# Patient Record
Sex: Male | Born: 1935 | Race: White | Hispanic: No | Marital: Single | Smoking: Former smoker
Health system: Southern US, Community
[De-identification: ages and names within clinical notes are randomized; demographics above are authoritative.]

## PROBLEM LIST (undated history)

## (undated) DIAGNOSIS — I7100 Dissection of unspecified site of aorta: Secondary | ICD-10-CM

## (undated) DIAGNOSIS — J449 Chronic obstructive pulmonary disease, unspecified: Secondary | ICD-10-CM

## (undated) DIAGNOSIS — F0150 Vascular dementia without behavioral disturbance: Secondary | ICD-10-CM

## (undated) DIAGNOSIS — I10 Essential (primary) hypertension: Secondary | ICD-10-CM

## (undated) DIAGNOSIS — G40909 Epilepsy, unspecified, not intractable, without status epilepticus: Secondary | ICD-10-CM

## (undated) DIAGNOSIS — N289 Disorder of kidney and ureter, unspecified: Secondary | ICD-10-CM

## (undated) DIAGNOSIS — G259 Extrapyramidal and movement disorder, unspecified: Secondary | ICD-10-CM

## (undated) DIAGNOSIS — I82409 Acute embolism and thrombosis of unspecified deep veins of unspecified lower extremity: Secondary | ICD-10-CM

## (undated) HISTORY — DX: Dissection of unspecified site of aorta: I71.00

## (undated) HISTORY — DX: Epilepsy, unspecified, not intractable, without status epilepticus: G40.909

## (undated) HISTORY — DX: Vascular dementia, unspecified severity, without behavioral disturbance, psychotic disturbance, mood disturbance, and anxiety: F01.50

## (undated) HISTORY — DX: Essential (primary) hypertension: I10

## (undated) HISTORY — DX: Disorder of kidney and ureter, unspecified: N28.9

## (undated) HISTORY — PX: HERNIA REPAIR: SHX51

## (undated) HISTORY — PX: APPENDECTOMY: SHX54

## (undated) HISTORY — DX: Extrapyramidal and movement disorder, unspecified: G25.9

## (undated) HISTORY — DX: Chronic obstructive pulmonary disease, unspecified: J44.9

## (undated) HISTORY — PX: PELVIC AND PARA-AORTIC LYMPH NODE DISSECTION: SHX2190

## (undated) HISTORY — DX: Acute embolism and thrombosis of unspecified deep veins of unspecified lower extremity: I82.409

---

## 2004-06-26 ENCOUNTER — Inpatient Hospital Stay: Payer: Self-pay | Admitting: Internal Medicine

## 2004-06-26 ENCOUNTER — Other Ambulatory Visit: Payer: Self-pay

## 2004-07-09 ENCOUNTER — Ambulatory Visit: Payer: Self-pay | Admitting: Internal Medicine

## 2004-07-13 ENCOUNTER — Ambulatory Visit: Payer: Self-pay | Admitting: Internal Medicine

## 2004-07-14 ENCOUNTER — Ambulatory Visit: Payer: Self-pay | Admitting: Internal Medicine

## 2004-07-17 ENCOUNTER — Ambulatory Visit: Payer: Self-pay | Admitting: Internal Medicine

## 2004-07-24 ENCOUNTER — Ambulatory Visit: Payer: Self-pay | Admitting: Internal Medicine

## 2004-07-30 ENCOUNTER — Ambulatory Visit: Payer: Self-pay | Admitting: Internal Medicine

## 2004-08-07 ENCOUNTER — Ambulatory Visit: Payer: Self-pay | Admitting: Internal Medicine

## 2004-08-14 ENCOUNTER — Ambulatory Visit: Payer: Self-pay | Admitting: Internal Medicine

## 2004-08-28 ENCOUNTER — Ambulatory Visit: Payer: Self-pay | Admitting: Internal Medicine

## 2004-09-11 ENCOUNTER — Ambulatory Visit: Payer: Self-pay | Admitting: Internal Medicine

## 2004-10-12 ENCOUNTER — Ambulatory Visit: Payer: Self-pay | Admitting: Internal Medicine

## 2004-11-09 ENCOUNTER — Ambulatory Visit: Payer: Self-pay | Admitting: Internal Medicine

## 2004-11-13 ENCOUNTER — Inpatient Hospital Stay (HOSPITAL_COMMUNITY): Admission: EM | Admit: 2004-11-13 | Discharge: 2004-11-24 | Payer: Self-pay | Admitting: Emergency Medicine

## 2004-11-13 ENCOUNTER — Ambulatory Visit: Payer: Self-pay | Admitting: Internal Medicine

## 2004-11-16 ENCOUNTER — Ambulatory Visit: Payer: Self-pay | Admitting: Cardiology

## 2004-11-16 ENCOUNTER — Encounter: Payer: Self-pay | Admitting: Cardiology

## 2004-11-26 ENCOUNTER — Ambulatory Visit: Payer: Self-pay | Admitting: Internal Medicine

## 2004-12-10 ENCOUNTER — Ambulatory Visit: Payer: Self-pay | Admitting: Internal Medicine

## 2004-12-24 ENCOUNTER — Ambulatory Visit: Payer: Self-pay | Admitting: Internal Medicine

## 2005-01-26 ENCOUNTER — Ambulatory Visit: Payer: Self-pay | Admitting: Internal Medicine

## 2005-02-08 ENCOUNTER — Ambulatory Visit: Payer: Self-pay | Admitting: Internal Medicine

## 2005-02-26 ENCOUNTER — Ambulatory Visit: Payer: Self-pay | Admitting: Internal Medicine

## 2005-06-11 ENCOUNTER — Ambulatory Visit: Payer: Self-pay | Admitting: Internal Medicine

## 2005-07-02 ENCOUNTER — Ambulatory Visit: Payer: Self-pay | Admitting: Internal Medicine

## 2005-08-03 ENCOUNTER — Ambulatory Visit: Payer: Self-pay | Admitting: Internal Medicine

## 2005-12-09 ENCOUNTER — Ambulatory Visit: Payer: Self-pay | Admitting: Internal Medicine

## 2005-12-15 ENCOUNTER — Ambulatory Visit: Payer: Self-pay | Admitting: Internal Medicine

## 2006-01-10 ENCOUNTER — Ambulatory Visit: Payer: Self-pay | Admitting: Internal Medicine

## 2006-01-31 ENCOUNTER — Ambulatory Visit: Payer: Self-pay | Admitting: Internal Medicine

## 2006-03-01 ENCOUNTER — Ambulatory Visit: Payer: Self-pay | Admitting: Internal Medicine

## 2006-03-14 ENCOUNTER — Ambulatory Visit: Payer: Self-pay | Admitting: Internal Medicine

## 2006-04-11 ENCOUNTER — Ambulatory Visit: Payer: Self-pay | Admitting: Internal Medicine

## 2006-04-11 LAB — CONVERTED CEMR LAB: Hgb A1c MFr Bld: 5.3 %

## 2006-04-14 ENCOUNTER — Ambulatory Visit: Payer: Self-pay | Admitting: Internal Medicine

## 2006-04-27 ENCOUNTER — Ambulatory Visit: Payer: Self-pay | Admitting: Gastroenterology

## 2006-05-11 ENCOUNTER — Encounter (INDEPENDENT_AMBULATORY_CARE_PROVIDER_SITE_OTHER): Payer: Self-pay | Admitting: Specialist

## 2006-05-11 ENCOUNTER — Ambulatory Visit: Payer: Self-pay | Admitting: Gastroenterology

## 2006-07-20 ENCOUNTER — Ambulatory Visit: Payer: Self-pay | Admitting: Internal Medicine

## 2006-09-26 ENCOUNTER — Encounter: Payer: Self-pay | Admitting: Internal Medicine

## 2006-09-26 DIAGNOSIS — I10 Essential (primary) hypertension: Secondary | ICD-10-CM | POA: Insufficient documentation

## 2006-09-26 DIAGNOSIS — E119 Type 2 diabetes mellitus without complications: Secondary | ICD-10-CM | POA: Insufficient documentation

## 2006-09-26 DIAGNOSIS — J449 Chronic obstructive pulmonary disease, unspecified: Secondary | ICD-10-CM

## 2006-12-08 ENCOUNTER — Encounter: Payer: Self-pay | Admitting: Internal Medicine

## 2006-12-16 ENCOUNTER — Encounter: Payer: Self-pay | Admitting: Internal Medicine

## 2007-01-11 ENCOUNTER — Encounter (INDEPENDENT_AMBULATORY_CARE_PROVIDER_SITE_OTHER): Payer: Self-pay | Admitting: *Deleted

## 2007-01-16 ENCOUNTER — Ambulatory Visit: Payer: Self-pay | Admitting: Internal Medicine

## 2007-01-17 LAB — CONVERTED CEMR LAB
Albumin: 3.4 g/dL — ABNORMAL LOW (ref 3.5–5.2)
CO2: 27 meq/L (ref 19–32)
GFR calc Af Amer: 77 mL/min
GFR calc non Af Amer: 64 mL/min
Glucose, Bld: 86 mg/dL (ref 70–99)
Phosphorus: 3.7 mg/dL (ref 2.3–4.6)
Sodium: 145 meq/L (ref 135–145)
TSH: 2.97 microintl units/mL (ref 0.35–5.50)

## 2007-03-22 ENCOUNTER — Telehealth: Payer: Self-pay | Admitting: Internal Medicine

## 2007-05-05 ENCOUNTER — Ambulatory Visit: Payer: Self-pay | Admitting: Internal Medicine

## 2007-07-04 ENCOUNTER — Telehealth (INDEPENDENT_AMBULATORY_CARE_PROVIDER_SITE_OTHER): Payer: Self-pay | Admitting: *Deleted

## 2007-07-20 ENCOUNTER — Ambulatory Visit: Payer: Self-pay | Admitting: Internal Medicine

## 2007-07-21 LAB — CONVERTED CEMR LAB
CO2: 29 meq/L (ref 19–32)
Calcium: 9.1 mg/dL (ref 8.4–10.5)
Creatinine, Ser: 1.2 mg/dL (ref 0.4–1.5)
Eosinophils Absolute: 0.3 10*3/uL (ref 0.0–0.6)
Eosinophils Relative: 4.3 % (ref 0.0–5.0)
GFR calc Af Amer: 77 mL/min
GFR calc non Af Amer: 64 mL/min
Glucose, Bld: 91 mg/dL (ref 70–99)
HCT: 42 % (ref 39.0–52.0)
Hemoglobin: 14.7 g/dL (ref 13.0–17.0)
Lymphocytes Relative: 10.5 % — ABNORMAL LOW (ref 12.0–46.0)
MCHC: 34.9 g/dL (ref 30.0–36.0)
Neutro Abs: 6 10*3/uL (ref 1.4–7.7)
Neutrophils Relative %: 77.8 % — ABNORMAL HIGH (ref 43.0–77.0)
PSA: 2.41 ng/mL (ref 0.10–4.00)
Sodium: 141 meq/L (ref 135–145)
TSH: 3.16 microintl units/mL (ref 0.35–5.50)
Total CHOL/HDL Ratio: 5.6
Triglycerides: 227 mg/dL (ref 0–149)

## 2007-07-28 ENCOUNTER — Ambulatory Visit: Payer: Self-pay

## 2007-07-28 ENCOUNTER — Encounter: Payer: Self-pay | Admitting: Internal Medicine

## 2008-01-22 ENCOUNTER — Ambulatory Visit: Payer: Self-pay | Admitting: Internal Medicine

## 2008-01-24 LAB — CONVERTED CEMR LAB
BUN: 13 mg/dL (ref 6–23)
Basophils Absolute: 0 10*3/uL (ref 0.0–0.1)
Calcium: 8.8 mg/dL (ref 8.4–10.5)
Chloride: 107 meq/L (ref 96–112)
Cholesterol: 147 mg/dL (ref 0–200)
Creatinine, Ser: 1.3 mg/dL (ref 0.4–1.5)
Direct LDL: 82.8 mg/dL
Eosinophils Absolute: 0.2 10*3/uL (ref 0.0–0.7)
Eosinophils Relative: 3.8 % (ref 0.0–5.0)
GFR calc Af Amer: 70 mL/min
Glucose, Bld: 57 mg/dL — ABNORMAL LOW (ref 70–99)
HCT: 39.5 % (ref 39.0–52.0)
Hemoglobin: 13.4 g/dL (ref 13.0–17.0)
Lymphocytes Relative: 13.2 % (ref 12.0–46.0)
Neutro Abs: 4.8 10*3/uL (ref 1.4–7.7)
Phosphorus: 3.8 mg/dL (ref 2.3–4.6)
Platelets: 303 10*3/uL (ref 150–400)
Potassium: 4.6 meq/L (ref 3.5–5.1)
RDW: 13.1 % (ref 11.5–14.6)
Total CHOL/HDL Ratio: 5.4
WBC: 6.4 10*3/uL (ref 4.5–10.5)

## 2008-02-14 ENCOUNTER — Encounter: Payer: Self-pay | Admitting: Internal Medicine

## 2008-04-26 ENCOUNTER — Ambulatory Visit: Payer: Self-pay | Admitting: Internal Medicine

## 2008-07-24 ENCOUNTER — Ambulatory Visit: Payer: Self-pay | Admitting: Internal Medicine

## 2008-07-25 LAB — CONVERTED CEMR LAB
CO2: 31 meq/L (ref 19–32)
Creatinine,U: 193.5 mg/dL
Eosinophils Relative: 4.1 % (ref 0.0–5.0)
GFR calc non Af Amer: 63 mL/min
Glucose, Bld: 81 mg/dL (ref 70–99)
Hemoglobin: 14.2 g/dL (ref 13.0–17.0)
Hgb A1c MFr Bld: 5.6 % (ref 4.6–6.0)
Lymphocytes Relative: 12 % (ref 12.0–46.0)
Microalb Creat Ratio: 4.1 mg/g (ref 0.0–30.0)
Microalb, Ur: 0.8 mg/dL (ref 0.0–1.9)
Monocytes Relative: 8.7 % (ref 3.0–12.0)
Platelets: 256 10*3/uL (ref 150–400)
Potassium: 5 meq/L (ref 3.5–5.1)
RDW: 13.2 % (ref 11.5–14.6)
Sodium: 143 meq/L (ref 135–145)
WBC: 6.6 10*3/uL (ref 4.5–10.5)

## 2009-01-17 ENCOUNTER — Observation Stay (HOSPITAL_COMMUNITY): Admission: EM | Admit: 2009-01-17 | Discharge: 2009-01-19 | Payer: Self-pay | Admitting: Emergency Medicine

## 2009-01-17 ENCOUNTER — Encounter: Payer: Self-pay | Admitting: Internal Medicine

## 2009-01-17 ENCOUNTER — Ambulatory Visit: Payer: Self-pay | Admitting: Internal Medicine

## 2009-01-20 ENCOUNTER — Ambulatory Visit: Payer: Self-pay | Admitting: Internal Medicine

## 2009-02-03 ENCOUNTER — Encounter: Payer: Self-pay | Admitting: Internal Medicine

## 2009-02-19 ENCOUNTER — Ambulatory Visit: Payer: Self-pay | Admitting: Internal Medicine

## 2009-02-19 ENCOUNTER — Telehealth: Payer: Self-pay | Admitting: Internal Medicine

## 2009-02-19 ENCOUNTER — Inpatient Hospital Stay (HOSPITAL_COMMUNITY): Admission: EM | Admit: 2009-02-19 | Discharge: 2009-02-27 | Payer: Self-pay | Admitting: Emergency Medicine

## 2009-02-19 ENCOUNTER — Ambulatory Visit: Payer: Self-pay | Admitting: Cardiology

## 2009-02-20 ENCOUNTER — Encounter (INDEPENDENT_AMBULATORY_CARE_PROVIDER_SITE_OTHER): Payer: Self-pay | Admitting: Internal Medicine

## 2009-02-20 ENCOUNTER — Ambulatory Visit: Payer: Self-pay | Admitting: Vascular Surgery

## 2009-02-24 ENCOUNTER — Encounter (INDEPENDENT_AMBULATORY_CARE_PROVIDER_SITE_OTHER): Payer: Self-pay | Admitting: Internal Medicine

## 2009-03-31 ENCOUNTER — Telehealth: Payer: Self-pay | Admitting: Internal Medicine

## 2009-04-02 ENCOUNTER — Ambulatory Visit: Payer: Self-pay | Admitting: Internal Medicine

## 2009-04-02 DIAGNOSIS — G40909 Epilepsy, unspecified, not intractable, without status epilepticus: Secondary | ICD-10-CM

## 2009-04-02 DIAGNOSIS — E538 Deficiency of other specified B group vitamins: Secondary | ICD-10-CM | POA: Insufficient documentation

## 2009-04-03 LAB — CONVERTED CEMR LAB: Valproic Acid Lvl: 78.9 ug/mL (ref 50.0–100.0)

## 2009-04-07 LAB — CONVERTED CEMR LAB
Albumin: 3.1 g/dL — ABNORMAL LOW (ref 3.5–5.2)
Alkaline Phosphatase: 43 units/L (ref 39–117)
Basophils Absolute: 0 10*3/uL (ref 0.0–0.1)
Hemoglobin: 13.4 g/dL (ref 13.0–17.0)
Lymphocytes Relative: 14.7 % (ref 12.0–46.0)
Monocytes Relative: 11.5 % (ref 3.0–12.0)
Neutro Abs: 3.6 10*3/uL (ref 1.4–7.7)
RBC: 4.36 M/uL (ref 4.22–5.81)
RDW: 13.3 % (ref 11.5–14.6)
Total Protein: 6.6 g/dL (ref 6.0–8.3)
WBC: 5.1 10*3/uL (ref 4.5–10.5)

## 2009-04-09 ENCOUNTER — Telehealth: Payer: Self-pay | Admitting: Internal Medicine

## 2009-04-10 ENCOUNTER — Ambulatory Visit: Payer: Self-pay | Admitting: Internal Medicine

## 2009-05-06 ENCOUNTER — Ambulatory Visit: Payer: Self-pay | Admitting: Internal Medicine

## 2009-05-07 LAB — CONVERTED CEMR LAB: Valproic Acid Lvl: 47.2 ug/mL — ABNORMAL LOW (ref 50.0–100.0)

## 2009-05-08 ENCOUNTER — Telehealth: Payer: Self-pay | Admitting: Internal Medicine

## 2009-05-21 ENCOUNTER — Telehealth: Payer: Self-pay | Admitting: Internal Medicine

## 2009-05-23 ENCOUNTER — Encounter: Payer: Self-pay | Admitting: Internal Medicine

## 2009-06-19 ENCOUNTER — Ambulatory Visit: Payer: Self-pay | Admitting: Internal Medicine

## 2009-07-22 ENCOUNTER — Ambulatory Visit: Payer: Self-pay | Admitting: Internal Medicine

## 2009-08-22 ENCOUNTER — Ambulatory Visit: Payer: Self-pay | Admitting: Internal Medicine

## 2009-09-08 ENCOUNTER — Ambulatory Visit: Payer: Self-pay | Admitting: Internal Medicine

## 2009-09-09 LAB — CONVERTED CEMR LAB
Albumin: 3.6 g/dL (ref 3.5–5.2)
Basophils Relative: 0 % (ref 0.0–3.0)
Calcium: 9.2 mg/dL (ref 8.4–10.5)
Chloride: 112 meq/L (ref 96–112)
Creatinine,U: 173.1 mg/dL
Eosinophils Absolute: 0.2 10*3/uL (ref 0.0–0.7)
Glucose, Bld: 88 mg/dL (ref 70–99)
HCT: 41.3 % (ref 39.0–52.0)
Hemoglobin: 13.9 g/dL (ref 13.0–17.0)
Lymphs Abs: 0.7 10*3/uL (ref 0.7–4.0)
MCHC: 33.5 g/dL (ref 30.0–36.0)
MCV: 92.6 fL (ref 78.0–100.0)
Monocytes Absolute: 0.4 10*3/uL (ref 0.1–1.0)
Neutro Abs: 4.6 10*3/uL (ref 1.4–7.7)
Potassium: 4.8 meq/L (ref 3.5–5.1)
RBC: 4.46 M/uL (ref 4.22–5.81)
TSH: 3.33 microintl units/mL (ref 0.35–5.50)
Total Protein: 6.9 g/dL (ref 6.0–8.3)

## 2009-09-23 ENCOUNTER — Ambulatory Visit: Payer: Self-pay | Admitting: Internal Medicine

## 2009-10-24 ENCOUNTER — Ambulatory Visit: Payer: Self-pay | Admitting: Internal Medicine

## 2009-10-28 ENCOUNTER — Telehealth: Payer: Self-pay | Admitting: Internal Medicine

## 2009-11-25 ENCOUNTER — Ambulatory Visit: Payer: Self-pay | Admitting: Internal Medicine

## 2009-12-26 ENCOUNTER — Ambulatory Visit: Payer: Self-pay | Admitting: Internal Medicine

## 2010-01-27 ENCOUNTER — Ambulatory Visit: Payer: Self-pay | Admitting: Internal Medicine

## 2010-02-27 ENCOUNTER — Ambulatory Visit: Payer: Self-pay | Admitting: Internal Medicine

## 2010-03-16 ENCOUNTER — Ambulatory Visit: Payer: Self-pay | Admitting: Internal Medicine

## 2010-03-16 LAB — HM DIABETES FOOT EXAM

## 2010-03-17 LAB — CONVERTED CEMR LAB
Albumin: 3.6 g/dL (ref 3.5–5.2)
Alkaline Phosphatase: 55 units/L (ref 39–117)
Basophils Relative: 0.4 % (ref 0.0–3.0)
Bilirubin, Direct: 0.1 mg/dL (ref 0.0–0.3)
Direct LDL: 108.3 mg/dL
Eosinophils Absolute: 0.3 10*3/uL (ref 0.0–0.7)
Glucose, Bld: 88 mg/dL (ref 70–99)
HDL: 37.9 mg/dL — ABNORMAL LOW (ref >39.00)
Lymphocytes Relative: 10.3 % — ABNORMAL LOW (ref 12.0–46.0)
MCHC: 34.5 g/dL (ref 30.0–36.0)
Neutrophils Relative %: 78.4 % — ABNORMAL HIGH (ref 43.0–77.0)
Phosphorus: 3.1 mg/dL (ref 2.3–4.6)
Platelets: 307 10*3/uL (ref 150.0–400.0)
Potassium: 4.8 meq/L (ref 3.5–5.1)
RBC: 4.47 M/uL (ref 4.22–5.81)
Sodium: 140 meq/L (ref 135–145)
Total Bilirubin: 0.4 mg/dL (ref 0.3–1.2)
Total CHOL/HDL Ratio: 4
Total Protein: 6.5 g/dL (ref 6.0–8.3)
VLDL: 44 mg/dL — ABNORMAL HIGH (ref 0.0–40.0)
Valproic Acid Lvl: 25 ug/mL — ABNORMAL LOW (ref 50.0–100.0)
WBC: 7.4 10*3/uL (ref 4.5–10.5)

## 2010-03-24 ENCOUNTER — Encounter: Payer: Self-pay | Admitting: Internal Medicine

## 2010-03-31 ENCOUNTER — Ambulatory Visit: Payer: Self-pay | Admitting: Internal Medicine

## 2010-05-01 ENCOUNTER — Ambulatory Visit: Payer: Self-pay | Admitting: Internal Medicine

## 2010-06-02 ENCOUNTER — Ambulatory Visit: Payer: Self-pay | Admitting: Internal Medicine

## 2010-06-17 ENCOUNTER — Telehealth: Payer: Self-pay | Admitting: Internal Medicine

## 2010-07-03 ENCOUNTER — Ambulatory Visit
Admission: RE | Admit: 2010-07-03 | Discharge: 2010-07-03 | Payer: Self-pay | Source: Home / Self Care | Attending: Internal Medicine | Admitting: Internal Medicine

## 2010-07-28 NOTE — Assessment & Plan Note (Signed)
Summary: 6 month follow up/rbh   Vital Signs:  Patient profile:   76 year old male Weight:      175 pounds BMI:     25.56 Temp:     97.7 degrees F oral Pulse rate:   76 / minute Pulse rhythm:   regular BP sitting:   102 / 60  (left arm) Cuff size:   large  Vitals Entered By: Mervin Hack CMA [facility] Dull) (March 16, 2010 10:36 AM) CC: 6 month follow-up   History of Present Illness: Doing fairly well Notes that he found a squirrel in his living room--issues with them getting in through attic No new medical concerns  Checks sugars every other week or so Last 107--never elevated Benign eye exam about 2 months ago  No seizures still on meds still drives--wants to keep meds the same  No chest pain No SOB No abd pain  Rarely coughs no change in exercise tolerance  Allergies: No Known Drug Allergies  Past History:  Past medical, surgical, family and social histories (including risk factors) reviewed for relevance to current acute and chronic problems.  Past Medical History: Reviewed history from 04/02/2009 and no changes required. COPD Diabetes mellitus, type II Hypertension Aortic dissection type B hx of DVT Renal Insufficiency Movement disorder Seizure disorder  [facility] Dr Jeffie Pollock  Past Surgical History: Reviewed history from 04/02/2009 and no changes required. Appendicitis 1963 Bilateral inguinal hernia repair/pulm. embolism 1/06 CP Echo normal 5/06 Aortic dissection Type B Movement disorder--hospitalized 7/10 Mental status changes--?seizure 9/10  Family History: Reviewed history from 09/26/2006 and no changes required. Dad died@70  prob MI Mom died @84 --Altzheimers 1 brother died of COPD CAD in Dad HTN in Mom No prostate or colon cancer  Social History: Reviewed history from 09/26/2006 and no changes required. 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   Neck:  supple, no masses, no thyromegaly, no carotid bruits, and no cervical lymphadenopathy.   Lungs:  normal respiratory effort, no intercostal retractions, no accessory muscle use, and normal breath sounds.   Heart:  normal rate, regular rhythm, no murmur, and no gallop.   Abdomen:  soft, non-tender, and no masses.   Pulses:  faint in feet Extremities:  no edema Skin:  no suspicious lesions and no ulcerations.   Psych:  normally interactive, good eye contact, not anxious appearing, and not depressed appearing.    Diabetes Management Exam:    Foot Exam (with socks and/or shoes not present):       Sensory-Pinprick/Light touch:          Left medial foot (L-4): normal          Left dorsal foot (L-5): normal          Left lateral foot (S-1): diminished          Right medial foot (L-4): normal          Right dorsal foot (L-5): normal          Right lateral foot (S-1): diminished       Inspection:          Left foot: abnormal             Comments: bunion          Right foot: abnormal  Comments: bunion       Nails:          Left foot: fungal infection          Right foot: fungal infection    Eye Exam:       Eye Exam done elsewhere          Date: 12/26/2009          Results: No retinopathy          Done by: Ginette Otto ophtho   Impression & Recommendations:  Problem # 1:  HYPERTENSION (ICD-401.9) Assessment Unchanged  good control no wean of meds given aortic dissection  His updated medication list for this problem includes:    Labetalol Hcl 200 Mg Tabs (Labetalol hcl) .Marland Kitchen... 1/2 tab three times a day    Amlodipine Besylate 10 Mg Tabs (Amlodipine besylate) .Marland Kitchen... Take 1 tablet by mouth once a day    Diovan 160 Mg Tabs (Valsartan) .Marland Kitchen... Take one by mouth daily  BP today: 102/60 Prior BP: 120/60  (09/08/2009)  Labs Reviewed: K+: 4.8 (09/08/2009) Creat: : 1.4 (09/08/2009)   Chol: 147 (01/22/2008)   HDL: 27.4 (01/22/2008)   LDL: DEL (01/22/2008)   TG: 225 (01/22/2008)  Orders: TLB-Renal Function Panel (80069-RENAL) TLB-CBC Platelet - w/Differential (85025-CBCD) TLB-Hepatic/Liver Function Pnl (80076-HEPATIC) TLB-TSH (Thyroid Stimulating Hormone) (84443-TSH)  Problem # 2:  SEIZURE DISORDER (ICD-780.39) Assessment: Comment Only  no seizures on meds will check valproate level  His updated medication list for this problem includes:    Gabapentin 100 Mg Caps (Gabapentin) .Marland Kitchen... 1 tab by mouth three times a day for movement disorder    Depakote 250 Mg Tbec (Divalproex sodium) .Marland Kitchen... 1 tab by mouth two times a day for seizure  Orders: T- * Misc. Laboratory test [phone]) Specimen Handling (60454) Venipuncture (09811)  Problem # 3:  DIABETES MELLITUS, TYPE II (ICD-250.00) Assessment: Comment Only  well controlled without meds will check A1c  His updated medication list for this problem includes:    Diovan 160 Mg Tabs (Valsartan) .Marland Kitchen... Take one by mouth daily    Adult Aspirin Low Strength 81 Mg Tbdp (Aspirin) .Marland Kitchen... 1 daily  Orders: TLB-A1C / Hgb A1C (Glycohemoglobin) (83036-A1C) TLB-Lipid Panel (80061-LIPID)  Problem # 4:  COPD (ICD-496) Assessment: Unchanged doing well without meds  Complete Medication List: 1)  Labetalol Hcl 200 Mg Tabs (Labetalol hcl) .... 1/2 tab three times a day 2)  Amlodipine Besylate 10 Mg Tabs (Amlodipine besylate) .... Take 1 tablet by mouth once a day 3)  Diovan 160 Mg Tabs (Valsartan) .... Take one by mouth daily 4)  Gabapentin 100 Mg Caps (Gabapentin) .Marland Kitchen.. 1 tab by mouth three times a day for movement disorder 5)  Depakote 250 Mg Tbec (Divalproex sodium) .Marland Kitchen.. 1 tab by mouth two times a day for seizure 6)  Bayer Contour Test Strp (Glucose blood) .... Patient test once weekly 7)  Prevacid 30 Mg Cpdr (Lansoprazole) .... Take one by mouth  daily 8)  Multivitamins Tabs (Multiple vitamin) .... Take one by mouth daily 9)  Adult Aspirin Low Strength 81 Mg Tbdp (Aspirin) .Marland Kitchen.. 1 daily  Other Orders: Flu Vaccine 72yrs + (91478) Admin 1st Vaccine (29562) Admin 1st Vaccine [facility]) [phone])  Patient Instructions: 1)  Please stop the iron pill 2)  Please schedule a follow-up appointment in 6 months .   Current Allergies (reviewed today): No known allergies    Influenza Vaccine    Vaccine Type: Fluvax 3+    Site: left deltoid  Mfr: [employer]    Dose: 0.5 ml    Route: IM    Given by: Mervin Hack CMA (AAMA)    Exp. Date: 12/26/2010    Lot #: [id number]    VIS given: 01/19/07 version given March 16, 2010.  Flu Vaccine Consent Questions    Do you have a history of severe allergic reactions to this vaccine? no    Any prior history of allergic reactions to egg and/or gelatin? no    Do you have a sensitivity to the preservative Thimersol? no    Do you have a past history of Guillan-Barre Syndrome? no    Do you currently have an acute febrile illness? no    Have you ever had a severe reaction to latex? no    Vaccine information given and explained to patient? yes

## 2010-07-28 NOTE — Assessment & Plan Note (Signed)
Summary: B-12/LETVAK/JRR   Nurse Visit   Allergies: No Known Drug Allergies  Medication Administration  Injection # 1:    Medication: Vit B12 1000 mcg    Diagnosis: B12 DEFICIENCY (ICD-266.2)    Route: IM    Site: L deltoid    Exp Date: 10/27/2011    Lot #: [id number]    Mfr: [employer]    Patient tolerated injection without complications    Given by: Sydell Axon LPN (June 02, 2010 8:27 AM)  Orders Added: 1)  Vit B12 1000 mcg [J3420] 2)  Admin of Therapeutic Inj  intramuscular or subcutaneous [96372]  Prior Medications: LABETALOL HCL 200 MG TABS (LABETALOL HCL) 1/2 tab three times a day AMLODIPINE BESYLATE 10 MG TABS (AMLODIPINE BESYLATE) Take 1 tablet by mouth once a day DIOVAN 160 MG  TABS (VALSARTAN) Take one by mouth daily GABAPENTIN 100 MG CAPS (GABAPENTIN) 1 tab by mouth three times a day for movement disorder DEPAKOTE 250 MG TBEC (DIVALPROEX SODIUM) 1 tab by mouth two times a day for seizure BAYER CONTOUR TEST  STRP (GLUCOSE BLOOD) patient test once weekly PREVACID 30 MG  CPDR (LANSOPRAZOLE) Take one by mouth daily MULTIVITAMINS   TABS (MULTIPLE VITAMIN) Take one by mouth daily ADULT ASPIRIN LOW STRENGTH 81 MG  TBDP (ASPIRIN) 1 daily Current Allergies: No known allergies    Medication Administration  Injection # 1:    Medication: Vit B12 1000 mcg    Diagnosis: B12 DEFICIENCY (ICD-266.2)    Route: IM    Site: L deltoid    Exp Date: 10/27/2011    Lot #: [id number]    Mfr: [employer]    Patient tolerated injection without complications    Given by: Sydell Axon LPN (June 02, 2010 8:27 AM)  Orders Added: 1)  Vit B12 1000 mcg [J3420] 2)  Admin of Therapeutic Inj  intramuscular or subcutaneous [93818]

## 2010-07-28 NOTE — Assessment & Plan Note (Signed)
Summary: [name] B12/RBH   Nurse Visit   Allergies: No Known Drug Allergies  Medication Administration  Injection # 1:    Medication: Vit B12 1000 mcg    Diagnosis: B12 DEFICIENCY (ICD-266.2)    Route: IM    Site: L deltoid    Exp Date: 05/29/2011    Lot #: [id number]    Mfr: [employer]    Patient tolerated injection without complications    Given by: Linde Gillis CMA [facility]) (October 24, 2009 8:17 AM)  Orders Added: 1)  Vit B12 1000 mcg [J3420] 2)  Admin of Therapeutic Inj  intramuscular or subcutaneous [96045]

## 2010-07-28 NOTE — Assessment & Plan Note (Signed)
Summary: B12 SHOT/DLO   Nurse Visit   Allergies: No Known Drug Allergies  Medication Administration  Injection # 1:    Medication: Vit B12 1000 mcg    Diagnosis: B12 DEFICIENCY (ICD-266.2)    Route: IM    Site: R deltoid    Exp Date: 12/27/2011    Lot #: [id number]    Mfr: [employer]    Patient tolerated injection without complications    Given by: Linde Gillis CMA [facility] Dull) (May 01, 2010 8:33 AM)  Orders Added: 1)  Vit B12 1000 mcg [J3420] 2)  Admin of Therapeutic Inj  intramuscular or subcutaneous [16109]

## 2010-07-28 NOTE — Assessment & Plan Note (Signed)
Summary: LETVAK B12/RBH   Nurse Visit   Allergies: No Known Drug Allergies  Medication Administration  Injection # 1:    Medication: Vit B12 1000 mcg    Diagnosis: B12 DEFICIENCY (ICD-266.2)    Route: IM    Site: R deltoid    Exp Date: 06/28/2011    Lot #: [id number]    Mfr: [employer]    Patient tolerated injection without complications    Given by: Delilah Shan CMA (AAMA) (December 26, 2009 8:29 AM)  Orders Added: 1)  Admin of Therapeutic Inj  intramuscular or subcutaneous [96372] 2)  Vit B12 1000 mcg [J3420]

## 2010-07-28 NOTE — Assessment & Plan Note (Signed)
Summary: [name] b12/rbh   Nurse Visit   Allergies: No Known Drug Allergies  Medication Administration  Injection # 1:    Medication: Vit B12 1000 mcg    Diagnosis: B12 DEFICIENCY (ICD-266.2)    Route: IM    Site: R deltoid    Exp Date: 09/2011    Lot #: [id number]    Mfr: [employer]    Patient tolerated injection without complications    Given by: Lowella Petties CMA (February 27, 2010 8:42 AM)  Orders Added: 1)  Vit B12 1000 mcg [J3420] 2)  Admin of Therapeutic Inj  intramuscular or subcutaneous [96372]   Medication Administration  Injection # 1:    Medication: Vit B12 1000 mcg    Diagnosis: B12 DEFICIENCY (ICD-266.2)    Route: IM    Site: R deltoid    Exp Date: 09/2011    Lot #: [id number]    Mfr: [employer]    Patient tolerated injection without complications    Given by: Lowella Petties CMA (February 27, 2010 8:42 AM)  Orders Added: 1)  Vit B12 1000 mcg [J3420] 2)  Admin of Therapeutic Inj  intramuscular or subcutaneous [78295]

## 2010-07-28 NOTE — Assessment & Plan Note (Signed)
Summary: LETVAK B12/RBH   Nurse Visit   Allergies: No Known Drug Allergies  Medication Administration  Injection # 1:    Medication: Vit B12 1000 mcg    Diagnosis: B12 DEFICIENCY (ICD-266.2)    Route: IM    Site: R deltoid    Exp Date: 04/29/2011    Lot #: [id number]    Mfr: [employer]    Patient tolerated injection without complications    Given by: Linde Gillis CMA [facility] Dull) (September 23, 2009 8:24 AM)  Orders Added: 1)  Vit B12 1000 mcg [J3420] 2)  Admin of Therapeutic Inj  intramuscular or subcutaneous [29562]

## 2010-07-28 NOTE — Assessment & Plan Note (Signed)
Summary: [name] B12/RBH   Nurse Visit   Allergies: No Known Drug Allergies  Medication Administration  Injection # 1:    Medication: Vit B12 1000 mcg    Diagnosis: B12 DEFICIENCY (ICD-266.2)    Route: IM    Site: L deltoid    Exp Date: 02/27/2011    Lot #: [id number]    Mfr: [employer]    Patient tolerated injection without complications    Given by: Mervin Hack CMA [facility] Dull) (August 22, 2009 8:43 AM)  Orders Added: 1)  Vit B12 1000 mcg [J3420] 2)  Admin of Therapeutic Inj  intramuscular or subcutaneous [96372]   Medication Administration  Injection # 1:    Medication: Vit B12 1000 mcg    Diagnosis: B12 DEFICIENCY (ICD-266.2)    Route: IM    Site: L deltoid    Exp Date: 02/27/2011    Lot #: [id number]    Mfr: [employer]    Patient tolerated injection without complications    Given by: Mervin Hack CMA Duncan Dull) (August 22, 2009 8:43 AM)  Orders Added: 1)  Vit B12 1000 mcg [J3420] 2)  Admin of Therapeutic Inj  intramuscular or subcutaneous [86578]

## 2010-07-28 NOTE — Assessment & Plan Note (Signed)
Summary: B-12/LETVAK/JRR   Nurse Visit   Allergies: No Known Drug Allergies  Medication Administration  Injection # 1:    Medication: Vit B12 1000 mcg    Diagnosis: B12 DEFICIENCY (ICD-266.2)    Route: IM    Site: L deltoid    Exp Date: 11/27/2011    Lot #: [id number]    Mfr: [employer]    Patient tolerated injection without complications    Given by: Sydell Axon LPN (March 31, 2010 8:17 AM)  Orders Added: 1)  Vit B12 1000 mcg [J3420] 2)  Admin of Therapeutic Inj  intramuscular or subcutaneous [96372]   Medication Administration  Injection # 1:    Medication: Vit B12 1000 mcg    Diagnosis: B12 DEFICIENCY (ICD-266.2)    Route: IM    Site: L deltoid    Exp Date: 11/27/2011    Lot #: [id number]    Mfr: [employer]    Patient tolerated injection without complications    Given by: Sydell Axon LPN (March 31, 2010 8:17 AM)  Orders Added: 1)  Vit B12 1000 mcg [J3420] 2)  Admin of Therapeutic Inj  intramuscular or subcutaneous [91478]

## 2010-07-28 NOTE — Assessment & Plan Note (Signed)
Summary: Pelham Hennick B12/RBH   Nurse Visit   Allergies: No Known Drug Allergies  Medication Administration  Injection # 1:    Medication: Vit B12 1000 mcg    Diagnosis: B12 DEFICIENCY (ICD-266.2)    Route: IM    Site: L deltoid    Exp Date: 10/27/2010    Lot #: [id number]    Mfr: [employer]    Patient tolerated injection without complications    Given by: Mervin Hack CMA [facility] Dull) (July 22, 2009 8:32 AM)  Orders Added: 1)  Vit B12 1000 mcg [J3420] 2)  Admin of Therapeutic Inj  intramuscular or subcutaneous [96372]     Medication Administration  Injection # 1:    Medication: Vit B12 1000 mcg    Diagnosis: B12 DEFICIENCY (ICD-266.2)    Route: IM    Site: L deltoid    Exp Date: 10/27/2010    Lot #: [id number]    Mfr: [employer]    Patient tolerated injection without complications    Given by: Mervin Hack CMA (AAMA) (July 22, 2009 8:32 AM)  Orders Added: 1)  Vit B12 1000 mcg [J3420] 2)  Admin of Therapeutic Inj  intramuscular or subcutaneous [[id number]]

## 2010-07-28 NOTE — Progress Notes (Signed)
Summary: Med refill  Phone Note Refill Request Message from:  Patient on Oct 28, 2009 1:11 PM  patient walked in asking for refills of Contour test strips and iron 325mg , not on med active med list  is this ok to fill? pt uses [facility]   Method Requested: Electronic Initial call taken by: Mervin Hack CMA [facility] Dull),  Oct 28, 2009 1:12 PM  Follow-up for Phone Call        okay to refill iron can be gotten OTC but okay Rx #30 x 3 I will check this at next appt Follow-up by: Cindee Salt MD,  Oct 28, 2009 2:23 PM  Additional Follow-up for Phone Call Additional follow up Details #1::        Rx faxed to pharmacy Additional Follow-up by: DeShannon Smith CMA [facility] Dull),  Oct 28, 2009 2:28 PM    New/Updated Medications: BAYER CONTOUR TEST  STRP (GLUCOSE BLOOD) patient test once weekly IRON 325 (65 FE) MG TABS (FERROUS SULFATE) take 1 by mouth once daily Prescriptions: IRON 325 (65 FE) MG TABS (FERROUS SULFATE) take 1 by mouth once daily  #30 x 3   Entered by:   Mervin Hack CMA (AAMA)   Authorized by:   Cindee Salt MD   Signed by:   Mervin Hack CMA (AAMA) on 10/28/2009   Method used:   Elect[facility]ically to        [facility] [id number] (retail)       [address]       [place], [place]  [address]       Ph: [phone] or [phone]       Fax: [phone]   RxID:   [id number] BAYER CONTOUR TEST  STRP (GLUCOSE BLOOD) patient test once weekly  #6mth x 12   Entered by:   Mervin Hack CMA (AAMA)   Authorized by:   Cindee Salt MD   Signed by:   Mervin Hack CMA (AAMA) on 10/28/2009   Method used:   [facility]ically to        [facility] [address] (retail)       [address]       [place], [place]  [address]       Ph: [phone] or [phone]       Fax: [phone]   RxID:   [id number]

## 2010-07-28 NOTE — Assessment & Plan Note (Signed)
Summary: B12/DLO   Nurse Visit   Allergies: No Known Drug Allergies  Medication Administration  Injection # 1:    Medication: Vit B12 1000 mcg    Diagnosis: B12 DEFICIENCY (ICD-266.2)    Route: IM    Site: L deltoid    Exp Date: 06/28/2011    Lot #: [id number]    Mfr: [employer]    Patient tolerated injection without complications    Given by: Selena Batten Dance CMA (AAMA) (January 27, 2010 8:11 AM)  Orders Added: 1)  Vit B12 1000 mcg [J3420]

## 2010-07-28 NOTE — Letter (Signed)
Summary: Application for Handicapped Placard  Application for Handicapped Placard   Imported By: Maryln Gottron 03/30/2010 14:39:22  _____________________________________________________________________  External Attachment:    Type:   Image     Comment:   External Document

## 2010-07-28 NOTE — Assessment & Plan Note (Signed)
Summary: Jaylene Arrowood B12/RBH   Nurse Visit   Allergies: No Known Drug Allergies  Medication Administration  Injection # 1:    Medication: Vit B12 1000 mcg    Diagnosis: B12 DEFICIENCY (ICD-266.2)    Route: IM    Site: L deltoid    Exp Date: 04/28/2011    Lot #: [id number]    Mfr: [employer]    Patient tolerated injection without complications    Given by: Delilah Shan CMA (AAMA) (Nov 25, 2009 8:30 AM)  Orders Added: 1)  Admin of Therapeutic Inj  intramuscular or subcutaneous [96372] 2)  Vit B12 1000 mcg [J3420]   Medication Administration  Injection # 1:    Medication: Vit B12 1000 mcg    Diagnosis: B12 DEFICIENCY (ICD-266.2)    Route: IM    Site: L deltoid    Exp Date: 04/28/2011    Lot #: [id number]    Mfr: [employer]    Patient tolerated injection without complications    Given by: Delilah Shan CMA (AAMA) (Nov 25, 2009 8:30 AM)  Orders Added: 1)  Admin of Therapeutic Inj  intramuscular or subcutaneous [96372] 2)  Vit B12 1000 mcg [J3420]

## 2010-07-28 NOTE — Assessment & Plan Note (Signed)
Summary: 4 MONTH FOLLOW UP/[facility]   Vital Signs:  Patient profile:   76 year old male Weight:      172 pounds Temp:     97.9 degrees F oral Pulse rate:   60 / minute Pulse rhythm:   regular BP sitting:   120 / 60  (left arm) Cuff size:   large  Vitals Entered By: Mervin Hack CMA Duncan Dull) (September 08, 2009 10:27 AM) CC: 4 month follow-up   History of Present Illness: Doing fairly well No new concerns  No shaking or seizures No syncope No dizziness  checks sugars once a week Generally all under 110 No sores or pain in feet  No chest pain No SOB No edema  Allergies: No Known Drug Allergies  Past History:  Past medical, surgical, family and social histories (including risk factors) reviewed for relevance to current acute and chronic problems.  Past Medical History: Reviewed history from 04/02/2009 and no changes required. COPD Diabetes mellitus, type II Hypertension Aortic dissection type B hx of DVT Renal Insufficiency Movement disorder Seizure disorder  [facility]ULT[facility] Dr Jeffie Pollock  Past Surgical History: Reviewed history from 04/02/2009 and no changes required. Appendicitis 1963 Bilateral inguinal hernia repair/pulm. embolism 1/06 CP Echo normal 5/06 Aortic dissection Type B Movement disorder--hospitalized 7/10 Mental status changes--?seizure 9/10  Family History: Reviewed history from 09/26/2006 and no changes required. Dad died@70  prob MI Mom died @84 --Altzheimers 1 brother died of COPD CAD in Dad HTN in Mom No prostate or colon cancer  Social History: Reviewed history from 09/26/2006 and no changes required. 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   Neck:  supple, no masses, no thyromegaly, no carotid bruits, and no cervical lymphadenopathy.     Lungs:  normal respiratory effort and normal breath sounds.   Heart:  normal rate, regular rhythm, no murmur, and no gallop.   Pulses:  1+ in each foot Extremities:  no edema Non tender bunions bilat Neurologic:  alert & oriented X3, strength normal in all extremities, and gait normal.   Skin:  no suspicious lesions and no ulcerations.   Psych:  normally interactive, good eye contact, not anxious appearing, and not depressed appearing.    Diabetes Management Exam:    Foot Exam (with socks and/or shoes not present):       Sensory-Pinprick/Light touch:          Left medial foot (L-4): normal          Left dorsal foot (L-5): normal          Left lateral foot (S-1): normal          Right medial foot (L-4): normal          Right dorsal foot (L-5): normal          Right lateral foot (S-1): normal       Inspection:          Left foot: normal          Right foot: normal       Nails:          Left foot: fungal infection          Right foot: fungal infection   Impression & Recommendations:  Problem # 1:  SEIZURE DISORDER (ICD-780.39) Assessment Unchanged no recurrence discussed weaning  gabapentin--he is more comfortable just continuing  His updated medication list for this problem includes:    Gabapentin 100 Mg Caps (Gabapentin) .Marland Kitchen... 1 tab by mouth three times a day for movement disorder    Depakote 250 Mg Tbec (Divalproex sodium) .Marland Kitchen... 1 tab by mouth two times a day for seizure  Problem # 2:  AORTIC DISSECTION (TYOE B) 5/06 (ICD-441.00) Assessment: Unchanged no symptoms good BP control  Problem # 3:  HYPERTENSION (ICD-401.9) Assessment: Unchanged  good control no changes needed  His updated medication list for this problem includes:    Labetalol Hcl 200 Mg Tabs (Labetalol hcl) .Marland Kitchen... 1/2 tab three times a day    Amlodipine Besylate 10 Mg Tabs (Amlodipine besylate) .Marland Kitchen... Take 1 tablet by mouth once a day    Diovan 160 Mg Tabs (Valsartan) .Marland Kitchen... Take one by mouth daily  BP  today: 120/60 Prior BP: 100/60 (05/06/2009)  Labs Reviewed: K+: 5.0 (07/24/2008) Creat: : 1.2 (07/24/2008)   Chol: 147 (01/22/2008)   HDL: 27.4 (01/22/2008)   LDL: DEL (01/22/2008)   TG: 225 (01/22/2008)  Orders: TLB-Renal Function Panel (80069-RENAL) TLB-CBC Platelet - w/Differential (85025-CBCD) TLB-Hepatic/Liver Function Pnl (80076-HEPATIC) TLB-TSH (Thyroid Stimulating Hormone) (84443-TSH) Venipuncture (16109)  Problem # 4:  DIABETES MELLITUS, TYPE II (ICD-250.00) Assessment: Unchanged  seems to have excellant control without meds  His updated medication list for this problem includes:    Diovan 160 Mg Tabs (Valsartan) .Marland Kitchen... Take one by mouth daily    Adult Aspirin Low Strength 81 Mg Tbdp (Aspirin) .Marland Kitchen... 1 daily  Labs Reviewed: Creat: 1.2 (07/24/2008)     Last Eye Exam: No diabetic retinopathy.    (02/03/2009) Reviewed HgBA1c results: 5.6 (04/02/2009)  5.6 (07/24/2008)  Orders: TLB-Microalbumin/Creat Ratio, Urine (82043-MALB) TLB-A1C / Hgb A1C (Glycohemoglobin) (83036-A1C)  Complete Medication List: 1)  Labetalol Hcl 200 Mg Tabs (Labetalol hcl) .... 1/2 tab three times a day 2)  Amlodipine Besylate 10 Mg Tabs (Amlodipine besylate) .... Take 1 tablet by mouth once a day 3)  Diovan 160 Mg Tabs (Valsartan) .... Take one by mouth daily 4)  Prevacid 30 Mg Cpdr (Lansoprazole) .... Take one by mouth daily 5)  Multivitamins Tabs (Multiple vitamin) .... Take one by mouth daily 6)  Adult Aspirin Low Strength 81 Mg Tbdp (Aspirin) .Marland Kitchen.. 1 daily 7)  Gabapentin 100 Mg Caps (Gabapentin) .Marland Kitchen.. 1 tab by mouth three times a day for movement disorder 8)  Depakote 250 Mg Tbec (Divalproex sodium) .Marland Kitchen.. 1 tab by mouth two times a day for seizure  Patient Instructions: 1)  Please schedule a follow-up appointment in 6 months .   Current Allergies (reviewed today): No known allergies

## 2010-07-30 ENCOUNTER — Encounter: Payer: Self-pay | Admitting: Internal Medicine

## 2010-07-30 NOTE — Progress Notes (Signed)
  Phone Note Refill Request Message from:  Fax from Pharmacy on June 17, 2010 11:58 AM  Refills Requested: Medication #1:  GABAPENTIN 100 MG CAPS 1 tab by mouth three times a day for movement disorder please Advise refills  Initial call taken by: Ami Bullins CMA,  June 17, 2010 11:58 AM  Follow-up for Phone Call        ok to refill as needed  Follow-up by: Jacques Navy MD,  June 17, 2010 1:26 PM    Prescriptions: GABAPENTIN 100 MG CAPS (GABAPENTIN) 1 tab by mouth three times a day for movement disorder  #90 Capsule x 1   Entered by:   Ami Bullins CMA   Authorized by:   Jacques Navy MD   Signed by:   Bill Salinas CMA on 06/17/2010   Method used:   Electronically to        [facility]  [address] (retail)       [address]       [place], [place]  [address]       Ph: [phone] or [phone]       Fax: [phone]   RxID:   [id number]

## 2010-07-30 NOTE — Assessment & Plan Note (Signed)
Summary: Jaz Laningham/B-12/JRR   Nurse Visit   Allergies: No Known Drug Allergies  Medication Administration  Injection # 1:    Medication: Vit B12 1000 mcg    Diagnosis: B12 DEFICIENCY (ICD-266.2)    Route: IM    Site: R deltoid    Exp Date: 03/28/2012    Lot #: [id number]    Mfr: American Regent    Patient tolerated injection without complications    Given by: Linde Gillis CMA (AAMA) (July 03, 2010 8:09 AM)  Orders Added: 1)  Vit B12 1000 mcg [J3420] 2)  Admin of Therapeutic Inj  intramuscular or subcutaneous [78469]

## 2010-08-04 ENCOUNTER — Encounter: Payer: Self-pay | Admitting: Internal Medicine

## 2010-08-04 ENCOUNTER — Ambulatory Visit (INDEPENDENT_AMBULATORY_CARE_PROVIDER_SITE_OTHER): Payer: Medicare Other

## 2010-08-04 DIAGNOSIS — E538 Deficiency of other specified B group vitamins: Secondary | ICD-10-CM

## 2010-08-13 NOTE — Assessment & Plan Note (Signed)
Summary: letvak b12/rbh   Nurse Visit   Allergies: No Known Drug Allergies  Medication Administration  Injection # 1:    Medication: Vit B12 1000 mcg    Diagnosis: B12 DEFICIENCY (ICD-266.2)    Route: IM    Site: L deltoid    Exp Date: 03/28/2012    Lot #: [id number]    Mfr: American Regent    Patient tolerated injection without complications    Given by: Sydell Axon LPN (August 04, 2010 9:06 AM)  Orders Added: 1)  Vit B12 1000 mcg [J3420] 2)  Admin of Therapeutic Inj  intramuscular or subcutaneous [96372]   Medication Administration  Injection # 1:    Medication: Vit B12 1000 mcg    Diagnosis: B12 DEFICIENCY (ICD-266.2)    Route: IM    Site: L deltoid    Exp Date: 03/28/2012    Lot #: [id number]    Mfr: American Regent    Patient tolerated injection without complications    Given by: Sydell Axon LPN (August 04, 2010 9:06 AM)  Orders Added: 1)  Vit B12 1000 mcg [J3420] 2)  Admin of Therapeutic Inj  intramuscular or subcutaneous [[id number]]

## 2010-08-25 ENCOUNTER — Encounter: Payer: Self-pay | Admitting: Internal Medicine

## 2010-09-03 ENCOUNTER — Ambulatory Visit (INDEPENDENT_AMBULATORY_CARE_PROVIDER_SITE_OTHER): Payer: Medicare Other

## 2010-09-03 ENCOUNTER — Encounter: Payer: Self-pay | Admitting: Internal Medicine

## 2010-09-03 DIAGNOSIS — E538 Deficiency of other specified B group vitamins: Secondary | ICD-10-CM

## 2010-09-08 NOTE — Assessment & Plan Note (Signed)
Summary: B12 SHOT / LFW   Nurse Visit   Allergies: No Known Drug Allergies  Medication Administration  Injection # 1:    Medication: Vit B12 1000 mcg    Diagnosis: B12 DEFICIENCY (ICD-266.2)    Route: IM    Site: R deltoid    Exp Date: 03/28/2012    Lot #: [id number]    Mfr: American Regent    Patient tolerated injection without complications    Given by: Mervin Hack CMA (AAMA) (September 03, 2010 8:25 AM)  Orders Added: 1)  Vit B12 1000 mcg [J3420] 2)  Admin of Therapeutic Inj  intramuscular or subcutaneous [96372]   Medication Administration  Injection # 1:    Medication: Vit B12 1000 mcg    Diagnosis: B12 DEFICIENCY (ICD-266.2)    Route: IM    Site: R deltoid    Exp Date: 03/28/2012    Lot #: [id number]    Mfr: American Regent    Patient tolerated injection without complications    Given by: Mervin Hack CMA (AAMA) (September 03, 2010 8:25 AM)  Orders Added: 1)  Vit B12 1000 mcg [J3420] 2)  Admin of Therapeutic Inj  intramuscular or subcutaneous [[id number]]

## 2010-09-15 ENCOUNTER — Ambulatory Visit: Payer: Self-pay | Admitting: Internal Medicine

## 2010-09-16 ENCOUNTER — Ambulatory Visit: Payer: Self-pay | Admitting: Internal Medicine

## 2010-09-25 ENCOUNTER — Ambulatory Visit (INDEPENDENT_AMBULATORY_CARE_PROVIDER_SITE_OTHER): Payer: Medicare Other | Admitting: Internal Medicine

## 2010-09-25 ENCOUNTER — Encounter: Payer: Self-pay | Admitting: Internal Medicine

## 2010-09-25 VITALS — BP 122/66 | HR 70 | Temp 97.8°F | Ht 68.0 in | Wt 184.0 lb

## 2010-09-25 DIAGNOSIS — R569 Unspecified convulsions: Secondary | ICD-10-CM

## 2010-09-25 DIAGNOSIS — J449 Chronic obstructive pulmonary disease, unspecified: Secondary | ICD-10-CM

## 2010-09-25 DIAGNOSIS — I10 Essential (primary) hypertension: Secondary | ICD-10-CM

## 2010-09-25 DIAGNOSIS — E119 Type 2 diabetes mellitus without complications: Secondary | ICD-10-CM

## 2010-09-25 NOTE — Progress Notes (Signed)
  Subjective:    Patient ID: [name], male    DOB: 09-Oct-1934, 76 y.o.   MRN: [id number]  HPI DOing okay No new concerns  No heart problems No chest pain or SOB No edema  Still checks sugars about weekly 80s- 90s in general Had eye exam within the year--everything fine  No seizures He is satisfied with continuing  Occ cough--nothing worrisome No change in exercise tolerance Still does yard work  Past Medical History  Diagnosis Date  . COPD (chronic obstructive pulmonary disease)   . Diabetes mellitus   . Hypertension   . Aortic dissection     type B  . Deep vein thrombosis     history of  . Renal insufficiency   . Movement disorder   . Seizure disorder     Past Surgical History  Procedure Date  . Appendectomy   . Hernia repair   . Pelvic and para-aortic lymph node dissection     type B    Family History  Problem Relation Age of Onset  . Alzheimer's disease Mother   . Hypertension Mother   . Heart disease Father   . COPD Brother     History   Social History  . Marital Status: Single    Spouse Name: N/A    Number of Children: N/A  . Years of Education: N/A   Occupational History  . Retired    Social History Main Topics  . Smoking status: Former Smoker -- 1.0 packs/day for 30 years    Types: Cigarettes    Quit date: 06/28/1980  . Smokeless tobacco: Never Used  . Alcohol Use: No  . Drug Use: Not on file  . Sexually Active: Not on file   Other Topics Concern  . Not on file   Social History Narrative  . No narrative on file      Review of Systems Eating well Weight up a few pounds--?10# Sleeps well Mood is fine    Objective:   Physical Exam  Constitutional: He appears well-developed and well-nourished. No distress.  Neck: Normal range of motion. Neck supple. No thyromegaly present.  Cardiovascular: Normal rate, regular rhythm, normal heart sounds and intact distal pulses.        1+ pedal pulses  Pulmonary/Chest: Effort normal  and breath sounds normal. He has no wheezes. He has no rales.  Musculoskeletal: He exhibits no edema and no tenderness.       Early bunions No synovitis  Lymphadenopathy:    He has no cervical adenopathy.  Skin:       Mycotic toenails Some scaling on plantar feet          Assessment & Plan:

## 2010-09-28 ENCOUNTER — Other Ambulatory Visit: Payer: Self-pay | Admitting: Internal Medicine

## 2010-10-02 LAB — GLUCOSE, CAPILLARY
Glucose-Capillary: 103 mg/dL — ABNORMAL HIGH (ref 70–99)
Glucose-Capillary: 122 mg/dL — ABNORMAL HIGH (ref 70–99)
Glucose-Capillary: 94 mg/dL (ref 70–99)

## 2010-10-02 LAB — VITAMIN B12: Vitamin B-12: 291 pg/mL (ref 211–911)

## 2010-10-02 LAB — FOLATE RBC: RBC Folate: 976 ng/mL — ABNORMAL HIGH (ref 180–600)

## 2010-10-02 LAB — RPR
RPR Ser Ql: NONREACTIVE
RPR Ser Ql: NONREACTIVE

## 2010-10-03 LAB — CK TOTAL AND CKMB (NOT AT ARMC)
CK, MB: 6.5 ng/mL — ABNORMAL HIGH (ref 0.3–4.0)
CK, MB: 7.7 ng/mL — ABNORMAL HIGH (ref 0.3–4.0)
Relative Index: 0.7 (ref 0.0–2.5)
Total CK: 1068 U/L — ABNORMAL HIGH (ref 7–232)
Total CK: 954 U/L — ABNORMAL HIGH (ref 7–232)

## 2010-10-03 LAB — BASIC METABOLIC PANEL
BUN: 10 mg/dL (ref 6–23)
BUN: 6 mg/dL (ref 6–23)
CO2: 23 mEq/L (ref 19–32)
CO2: 25 mEq/L (ref 19–32)
CO2: 25 mEq/L (ref 19–32)
CO2: 26 mEq/L (ref 19–32)
Calcium: 7.8 mg/dL — ABNORMAL LOW (ref 8.4–10.5)
Calcium: 7.9 mg/dL — ABNORMAL LOW (ref 8.4–10.5)
Calcium: 7.9 mg/dL — ABNORMAL LOW (ref 8.4–10.5)
Chloride: 105 mEq/L (ref 96–112)
Chloride: 112 mEq/L (ref 96–112)
Creatinine, Ser: 0.83 mg/dL (ref 0.4–1.5)
GFR calc Af Amer: >60 mL/min (ref >60)
GFR calc Af Amer: >60 mL/min (ref >60)
GFR calc Af Amer: >60 mL/min (ref >60)
GFR calc Af Amer: >60 mL/min (ref >60)
GFR calc non Af Amer: >60 mL/min (ref >60)
GFR calc non Af Amer: >60 mL/min (ref >60)
GFR calc non Af Amer: >60 mL/min (ref >60)
Glucose, Bld: 116 mg/dL — ABNORMAL HIGH (ref 70–99)
Glucose, Bld: 87 mg/dL (ref 70–99)
Glucose, Bld: 95 mg/dL (ref 70–99)
Potassium: 2.7 mEq/L — CL (ref 3.5–5.1)
Potassium: 3.6 mEq/L (ref 3.5–5.1)
Sodium: 137 mEq/L (ref 135–145)
Sodium: 138 mEq/L (ref 135–145)
Sodium: 139 mEq/L (ref 135–145)
Sodium: 140 mEq/L (ref 135–145)

## 2010-10-03 LAB — CBC
HCT: 33.4 % — ABNORMAL LOW (ref 39.0–52.0)
HCT: 34.1 % — ABNORMAL LOW (ref 39.0–52.0)
HCT: 35.1 % — ABNORMAL LOW (ref 39.0–52.0)
Hemoglobin: 11.5 g/dL — ABNORMAL LOW (ref 13.0–17.0)
Hemoglobin: 12 g/dL — ABNORMAL LOW (ref 13.0–17.0)
Hemoglobin: 13.8 g/dL (ref 13.0–17.0)
MCHC: 34.3 g/dL (ref 30.0–36.0)
Platelets: 129 10*3/uL — ABNORMAL LOW (ref 150–400)
RBC: 4.39 MIL/uL (ref 4.22–5.81)
RDW: 14 % (ref 11.5–15.5)
RDW: 14.4 % (ref 11.5–15.5)
RDW: 14.5 % (ref 11.5–15.5)
RDW: 14.6 % (ref 11.5–15.5)
WBC: 6.4 10*3/uL (ref 4.0–10.5)

## 2010-10-03 LAB — URINALYSIS, ROUTINE W REFLEX MICROSCOPIC
Leukocytes, UA: NEGATIVE
Nitrite: NEGATIVE
Specific Gravity, Urine: 1.019 (ref 1.005–1.030)
Specific Gravity, Urine: 1.028 (ref 1.005–1.030)
pH: 6 (ref 5.0–8.0)
pH: 6.5 (ref 5.0–8.0)

## 2010-10-03 LAB — GLUCOSE, CAPILLARY
Glucose-Capillary: 100 mg/dL — ABNORMAL HIGH (ref 70–99)
Glucose-Capillary: 103 mg/dL — ABNORMAL HIGH (ref 70–99)
Glucose-Capillary: 105 mg/dL — ABNORMAL HIGH (ref 70–99)
Glucose-Capillary: 108 mg/dL — ABNORMAL HIGH (ref 70–99)
Glucose-Capillary: 110 mg/dL — ABNORMAL HIGH (ref 70–99)
Glucose-Capillary: 116 mg/dL — ABNORMAL HIGH (ref 70–99)
Glucose-Capillary: 119 mg/dL — ABNORMAL HIGH (ref 70–99)
Glucose-Capillary: 129 mg/dL — ABNORMAL HIGH (ref 70–99)
Glucose-Capillary: 142 mg/dL — ABNORMAL HIGH (ref 70–99)
Glucose-Capillary: 76 mg/dL (ref 70–99)
Glucose-Capillary: 83 mg/dL (ref 70–99)
Glucose-Capillary: 86 mg/dL (ref 70–99)
Glucose-Capillary: 89 mg/dL (ref 70–99)
Glucose-Capillary: 94 mg/dL (ref 70–99)
Glucose-Capillary: 97 mg/dL (ref 70–99)

## 2010-10-03 LAB — BLOOD GAS, ARTERIAL
Acid-base deficit: 0.7 mmol/L (ref 0.0–2.0)
Bicarbonate: 22.3 mEq/L (ref 20.0–24.0)
Drawn by: 277341
O2 Content: 2 L/min
O2 Saturation: 96.4 %
TCO2: 23.2 mmol/L (ref 0–100)
pO2, Arterial: 75.4 mmHg — ABNORMAL LOW (ref 80.0–100.0)

## 2010-10-03 LAB — DIFFERENTIAL
Basophils Relative: 0 % (ref 0–1)
Lymphocytes Relative: 6 % — ABNORMAL LOW (ref 12–46)
Lymphs Abs: 0.5 10*3/uL — ABNORMAL LOW (ref 0.7–4.0)
Monocytes Absolute: 0.5 10*3/uL (ref 0.1–1.0)
Monocytes Relative: 6 % (ref 3–12)
Neutro Abs: 6.6 10*3/uL (ref 1.7–7.7)
Neutrophils Relative %: 87 % — ABNORMAL HIGH (ref 43–77)

## 2010-10-03 LAB — HEPATIC FUNCTION PANEL
ALT: 26 U/L (ref 0–53)
AST: 31 U/L (ref 0–37)
Albumin: 2.8 g/dL — ABNORMAL LOW (ref 3.5–5.2)
Total Protein: 5.6 g/dL — ABNORMAL LOW (ref 6.0–8.3)

## 2010-10-03 LAB — VALPROIC ACID LEVEL
Valproic Acid Lvl: 67.6 ug/mL (ref 50.0–100.0)
Valproic Acid Lvl: <10 ug/mL — ABNORMAL LOW (ref 50.0–100.0)

## 2010-10-03 LAB — URINE MICROSCOPIC-ADD ON

## 2010-10-03 LAB — COMPREHENSIVE METABOLIC PANEL
Albumin: 3.4 g/dL — ABNORMAL LOW (ref 3.5–5.2)
Alkaline Phosphatase: 48 U/L (ref 39–117)
BUN: 15 mg/dL (ref 6–23)
Calcium: 8.6 mg/dL (ref 8.4–10.5)
Glucose, Bld: 108 mg/dL — ABNORMAL HIGH (ref 70–99)
Potassium: 3.1 mEq/L — ABNORMAL LOW (ref 3.5–5.1)
Total Protein: 6.4 g/dL (ref 6.0–8.3)

## 2010-10-03 LAB — AMMONIA: Ammonia: 38 umol/L — ABNORMAL HIGH (ref 11–35)

## 2010-10-03 LAB — RAPID URINE DRUG SCREEN, HOSP PERFORMED
Barbiturates: NOT DETECTED
Benzodiazepines: NOT DETECTED
Cocaine: NOT DETECTED
Opiates: NOT DETECTED

## 2010-10-03 LAB — T4, FREE: Free T4: 0.92 ng/dL (ref 0.80–1.80)

## 2010-10-03 LAB — POCT I-STAT 3, ART BLOOD GAS (G3+)
O2 Saturation: 95 %
pCO2 arterial: 29.8 mmHg — ABNORMAL LOW (ref 35.0–45.0)
pH, Arterial: 7.489 — ABNORMAL HIGH (ref 7.350–7.450)

## 2010-10-03 LAB — PROTIME-INR: INR: 1.1 (ref 0.00–1.49)

## 2010-10-03 LAB — CK: Total CK: 439 U/L — ABNORMAL HIGH (ref 7–232)

## 2010-10-03 LAB — MAGNESIUM: Magnesium: 2 mg/dL (ref 1.5–2.5)

## 2010-10-04 LAB — POCT I-STAT, CHEM 8
Calcium, Ion: 1.11 mmol/L — ABNORMAL LOW (ref 1.12–1.32)
Chloride: 112 mEq/L (ref 96–112)
HCT: 43 % (ref 39.0–52.0)
Hemoglobin: 14.6 g/dL (ref 13.0–17.0)
Potassium: 4.7 mEq/L (ref 3.5–5.1)

## 2010-10-04 LAB — GLUCOSE, CAPILLARY
Glucose-Capillary: 103 mg/dL — ABNORMAL HIGH (ref 70–99)
Glucose-Capillary: 111 mg/dL — ABNORMAL HIGH (ref 70–99)
Glucose-Capillary: 203 mg/dL — ABNORMAL HIGH (ref 70–99)
Glucose-Capillary: 70 mg/dL (ref 70–99)
Glucose-Capillary: 92 mg/dL (ref 70–99)
Glucose-Capillary: 97 mg/dL (ref 70–99)

## 2010-10-04 LAB — COMPREHENSIVE METABOLIC PANEL
Alkaline Phosphatase: 56 U/L (ref 39–117)
BUN: 15 mg/dL (ref 6–23)
Chloride: 110 mEq/L (ref 96–112)
Creatinine, Ser: 1.11 mg/dL (ref 0.4–1.5)
Glucose, Bld: 125 mg/dL — ABNORMAL HIGH (ref 70–99)
Potassium: 4.1 mEq/L (ref 3.5–5.1)
Total Bilirubin: 0.7 mg/dL (ref 0.3–1.2)
Total Protein: 6.5 g/dL (ref 6.0–8.3)

## 2010-10-04 LAB — RAPID URINE DRUG SCREEN, HOSP PERFORMED
Barbiturates: NOT DETECTED
Benzodiazepines: NOT DETECTED

## 2010-10-06 ENCOUNTER — Ambulatory Visit: Payer: Medicare Other

## 2010-10-08 ENCOUNTER — Ambulatory Visit (INDEPENDENT_AMBULATORY_CARE_PROVIDER_SITE_OTHER): Payer: Medicare Other | Admitting: Internal Medicine

## 2010-10-08 DIAGNOSIS — E538 Deficiency of other specified B group vitamins: Secondary | ICD-10-CM

## 2010-10-08 MED ORDER — CYANOCOBALAMIN 1000 MCG/ML IJ SOLN
1000.0000 ug | Freq: Once | INTRAMUSCULAR | Status: AC
  Administered 2010-10-08: 1000 ug via INTRAMUSCULAR

## 2010-11-05 ENCOUNTER — Other Ambulatory Visit: Payer: Self-pay | Admitting: Internal Medicine

## 2010-11-10 NOTE — H&P (Signed)
[name], [name] NO.:  [id number]   MEDICAL RECORD NO.:  [id number]          PATIENT TYPE:  INP   LOCATION:  1825                         FACILITY:  [facility]   PHYSICIAN:  Ramiro Harvest, MD    DATE OF BIRTH:  05-16-1935   DATE OF ADMISSION:  02/19/2009  DATE OF DISCHARGE:                              HISTORY & PHYSICAL   PRIMARY CARE PHYSICIAN:  Karie Schwalbe, MD of [facility].   NEUROLOGIST:  Melvyn Novas, M.D.   HISTORY OF PRESENT ILLNESS:  [name] is a 76 year old white male  history of hypertension, type B aortic dissection, type 2 diabetes,  history of DVT in the past, movement disorders, COPD, presented to the  ED with a syncopal episode.  The patient is unable to remember the  events surrounding this, and no family is at bedside to give it.  History is thus obtained from the ED records.  Per records and per  patient, the patient was found down at home on the floor and was  confused and complaining of generalized weakness.  The patient stated  that he went to the bathroom and then blacked out.  He stated that his  son-in-law found him.  He cannot remember what happened.  The patient  denies any fevers.  No chills, no nausea or vomiting.  No chest pain or  shortness of breath, no headache, no abdominal pain, no hematemesis, no  hematochezia.  The patient is a little bit difficult to understand.  However, has severely dry mucous membranes.  The patient was seen in the  ED, found to be orthostatic.  Depakote level was less than 10.  C-met  with a potassium of 3.1, AST of 40, albumin of 3.4, otherwise was within  normal limits.  Urinalysis with specific gravity of 1.028, otherwise,  was within normal limits.  CBC within normal limits.  ABG had a pH of  7.49, pCO2 of 30, pO2 of 68.  Coags are within normal limits.  CK of  954, CK-MB of 6.5.  Chest x-ray was negative for any acute disease.  CT  of the head was negative.  We are called to  admit the patient for  further evaluation and management.   ALLERGIES:  NO KNOWN DRUG ALLERGIES PER PATIENT.   PAST MEDICAL HISTORY:  1. History of involuntary movement diagnosed July 2010.  2. Hypertension.  3. Diabetes.  4. COPD.  5. Status post hernia repair in December 2005 complicated by DVT.  6. DVT.  7. Phlebitis of the right upper extremity secondary to IV      infiltration.  8. Type B aortic dissection.  9. History of chronic cough.   HOME MEDICATIONS:  The patient is unable to give me a list of his home  medications, but list obtained per ED records shows:  1. Multivitamin 1 tablet p.o. daily.  2. Aspirin 81 mg p.o. daily.  3. Labetalol 400 mg p.o. t.i.d.  4. Depakote 500 mg p.o. b.i.d.  5. Neurontin 100 mg p.o. t.i.d.  6. Diovan 160 mg p.o. daily.  7. Norvasc 10 mg p.o. daily.   SOCIAL HISTORY:  No tobacco use.  No alcohol use.  No IV drug use.  The  patient lives alone.   FAMILY HISTORY:  Noncontributory.   REVIEW OF SYSTEMS:  As per HPI, otherwise negative.   PHYSICAL EXAMINATION:  VITAL SIGNS:  Temperature 97.7, blood pressure  140/86, pulse of 99, respirations 24, satting 94%.  Orthostatics lying  137/81 with a pulse of 80, sitting 137/85 with a pulse of 88, standing  102/73 with a pulse of 92.  GENERAL:  The patient is sleeping in no apparent distress, easily  arousable.  HEENT:  Normocephalic, atraumatic.  Pupils equal round reactive light  and accommodation.  Extraocular movements intact.  Oropharynx is clear.  No lesions, no exudates.  NECK:  Supple.  No lymphadenopathy.  Extremely dry mucous membranes.  LUNGS:  Clear to auscultation bilaterally.  CARDIOVASCULAR:  Regular rate and rhythm.  No murmurs, rubs or gallops.  ABDOMEN:  Soft, nontender, nondistended.  Positive bowel sounds.  EXTREMITIES:  No clubbing, cyanosis or edema.  NEUROLOGICAL:  The patient is alert and oriented x2.  Cranial nerves II-  XII are grossly intact.  The patient moves all  extremities  spontaneously.  The patient unable to complete a neuro exam as the  patient not following all commands.   LABORATORY DATA:  Depakote level less than 10.  Sodium of 140, potassium  3.1, chloride 108, bicarb 24, BUN 15, creatinine 1.12, glucose of 108,  bilirubin of 0.9, alk phosphatase 48, AST 40, ALT 24, calcium of 8.6,  albumin of 3.4, protein of 6.4.  CBC with a white count of 7.6,  hemoglobin 13.8, platelets of 164, hematocrit 40.5, ANC of 6.6.  Blood  gas has a pH of 7.49, pCO2 of 30, pO2 of 68, bicarb of 22, satting 95%  PT of 13.9, INR of 1.1.  UA is amber, clear, specific gravity 1.028, pH  6, glucose negative, bilirubin small, ketones 15, blood trace, protein  30, urobilinogen 0.2, nitrite negative, leukocytes negative.  Urine  microscopy:  RBC 0-2, CK of 954, CK-MB 6.5.  Depakote level of less than  10.   Chest x-ray shows hypo-expanded lungs with mild basilar atelectasis,  cardiomegaly findings compatible with __________ along the thoracic  spine.  CT of the head shows no acute intracranial abnormality.  Patchy  white matter disease most consistent with chronic small vessel ischemic  changes, remote lacunar injuries in the basilar, ganglia and thalamus.  EKG showed a normal sinus rhythm with nonspecific ST-T wave changes.  This is unchanged from prior EKG.   ASSESSMENT AND PLAN:  [name] is a 76 year old gentleman with history  of hypertension, diabetes, COPD, involuntary movement, prior history of  a DVT, history of type B aortic dissection, presenting to the ED with a  syncopal episode.  1. Syncope, questionable etiology.  Differential includes a cardiac      etiology versus neurological versus orthostatic.  Will admit the      patient to telemetry.  Cycle cardiac enzymes q.8 h x3.  Check a      magnesium level.  Check MRI of the head.  Check carotid Doppler.      Check a TSH.  Check a UDS.  Check a 2-D echo to rule out aortic      stenosis versus LV  dysfunction and hydrate with IV fluids.  If      negative workup, may consider checking an EEG and possible  neurology versus cardiology consult.  2. Dehydration.  Hydrate with IV fluids.  3. Hypokalemia.  Check a magnesium level.  Replete.  4. Hypertension.  Continue home dose labetalol.  Will hold Norvasc and      Diovan for now.  5. History of movement disorder.  Continue home dose gabapentin and      Depakote.  6. Type 2 diabetes, sliding scale insulin.  7. History of DVT.  8. Type B aortic dissection, stable.  Continue labetalol and aspirin.  9. COPD stable.  10.Prophylaxis.  Protonix for GI prophylaxis.  Lovenox for DVT      prophylaxis.   It has been a pleasure taking care of this patient.      Ramiro Harvest, MD  Electronically Signed     DT/MEDQ  D:  02/19/2009  T:  02/19/2009  Job:  [id number]   cc:   Karie Schwalbe, MD  Melvyn Novas, M.D.

## 2010-11-10 NOTE — Consult Note (Signed)
NAMEALEKXANDER, [name] NO.:  [id number]   MEDICAL RECORD NO.:  [id number]          PATIENT TYPE:  INP   LOCATION:  5127                         FACILITY:  [facility]   PHYSICIAN:  Melvyn Novas, M.D.  DATE OF BIRTH:  July 19, 1934   DATE OF CONSULTATION:  DATE OF DISCHARGE:                                 CONSULTATION   Mr. Delmas Faucett is a patient of the [place] [facility] followed by  Cardiology and primary care.   He is in meanwhile 76 year old Caucasian right-handed gentleman bachelor  who presents with generalized tremors and muscle twitching that appeared  first last night around midnight.  These twitches have continued and  increased in amplitude, and also he is not in pain.  He feels bothered and states that he could not fall asleep with that.  The movement is non-rhythmic or periodic and it does not show synchrony  between the two body hemispheres.  It increases when the patient is  asked to perform a maneuver, for example exert strength in a motor  resistance  maneuver and it is also increased after the tendon reflexes  are obtained .  Light touch, however, seems to decrease the movement.    The patient denies any loss of consciousness or impairment of his  mental status associated with the onset.  He was not in pain.  There  were no spasms, no shortness of breath, diaphoresis, no febrile illness.  He denies having taken any medications our of the ordinary and all his  current medications are given for over 2 years.  He also reports that he  does not use decongestants or over-the-counter medications except  occasional Advil.  He denies any drug use, alcohol use, and did not  smoke last night.  His primary care physician is Dr. Tillman Abide.   PAST MEDICAL HISTORY:  Positive for COPD, hernia repair, deep venous  thrombosis, phlebitis, diabetes mellitus, hypertension, aortic  dissection, AAA grade B followed by renal hypoperfusion and the patient  has now  chronic renal insufficiency.   SOCIAL HISTORY:  His sister-in-law reports that Mr. Meunier has never been  married, has always lived alone.  He rarely drinks alcohol.  He is a remote smoker.   FAMILY HISTORY:  Positive for hypertension.   Cranial CT was unremarkable.  His laboratory results have returned  unremarkable.  An EKG was unremarkable and shows normal sinus rhythm.   His vital signs show a blood pressure of 102/72 with a pulse rate of 75,  respiration of 17.  The patient is in no acute distress.  He is not  labored.  He does not seem to be in any way very bothered and he is  certainly not anxious.  He has some chronic back pain he states, but  even that is not currently an issue.  He felt a risk of falling at home  because of the trembling of his whole body.Marland Kitchen   LABORATORY DATA:  Sodium 142, potassium 4.7, chloride 112, BUN 24,  creatinine 1.5, glucose 91, calcium 1.1, CO2 of 21.  Hemoglobin 14.6,  hematocrit 43, white blood cell count is still pending as well as a tox  screen and cardiac enzymes.  The CT again showed atrophy and some  hypoattenuation subcortical and periventricular, these are deep white  matter diseases and microvascular diseases, and he has bilaterally in  the basal ganglia, lacunar infarctions, none of them look acute.   HOME MEDICATIONS:  1. Amlodipine 10 mg daily.  2. Diovan 160 mg in the evening.  3. 2 mg glimepiride every morning.  4. 200 mg tablets of labetalol, half of this in the morning, half at      noon, and half in p.m.  5. He is also taking 30 mg of Prevacid before evening meal.  6. 2 puffs of Spiriva as needed.   He did not name aspirin, Plavix, or Aggrenox.  He has in the past been  on Coumadin, but this was discontinued 2 years ago.  Coumadin had been  initiated after a deep venous thrombosis was discovered.   PHYSICAL EXAMINATION:  GENERAL:  This gentleman to have a regular heart  rate.  NECK:  No carotid bruit.  LUNGS:  Clear to  auscultation.  EXTREMITIES:  He has no peripheral edema, clubbing, or cyanosis.  Peripheral pulses are easily palpable.  ABDOMEN:  Soft and nontender.  He has normal bowel sounds.  NEUROLOGIC:  Again, noted is the non-rhythmic non-periodic jerking of  all extremities and trunk that affects time to time also the face.  He  is alert and oriented, has a fluent speech.  He does not wear his dentures and therefore, there is appearance of a  slight dysarthria, but there is no facial numbness or facial droop  noted.  He has a normal memory.  His extraocular movements were full.  He has no nystagmus, no ptosis, and equal pupils, and no papilledema,  just changes.  Sensory over the face is intact.  Tongue is midline, but  trembles occasionally also.  Motor examination is dominated by the non-synchronous, non-symmetric,  non-periodic twitching that increases after exertion and after deep  tendon reflexes were examined in the region of the exam.  There was no clonus noted.  He can lift all extremities antigravity  including both lower extremities.  He could perform a finger-to-nose  test.  Sensory is intact to touch and pinprick and temperature.  Coordination is only impaired by the tremor, but there is no ataxia or  dysmetria.    The patient did not want to walk for fear of falling.  He would  requeire two people supporting him.  He reports having almost fallen at home prior to coming here, which  urged his decision to comment all.  He has no foot drop, good bilateral grip strength, and his deep tendon  reflexes were symmetric with downgoing toes bilaterally.   ASSESSMENT:  It is quite mysterious to me what has happened to this  gentleman.  I think we need to add liver function tests, white blood  cell count, and a tox screen.  I would not mind obtaining cardiac  enzymes as well.  I doubt very much that Mr. Rhue had a stroke since he  has no focal motor abnormalities.  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  I could not find metabolic  abnormalities on the ER screen tests as provided at this time.  My suggestion is to admit this gentleman for  an observation period as he  feels unsafe to go home since he resides alone.  I also would like to  try him on a low dose of Neurontin about 100 mg t.i.d. and give him for  tonight 500 mg  of Depakote.  Depakote helps with myoclonic jerks even if not of central  origin.  Again, I was unable to make a definite diagnosis tonight, but would feel  that an observation period is indicated.  I discussed this in detail  with his Sylis Ketchum in the emergency room, Dr. Butler Denmark, M.D.  Electronically Signed     Melvyn Novas, M.D.  Electronically Signed    CD/MEDQ  D:  01/17/2009  T:  01/18/2009  Job:  [id number]   cc:   Karie Schwalbe, MD

## 2010-11-10 NOTE — Consult Note (Signed)
NAMESAMUELL, KNOBLE NO.:  [id number]   MEDICAL RECORD NO.:  [id number]          PATIENT TYPE:  INP   LOCATION:  5511                         FACILITY:  [facility]   PHYSICIAN:  Antonietta Breach, M.D.  DATE OF BIRTH:  [birth year]   DATE OF CONSULTATION:  02/26/2009  DATE OF DISCHARGE:  02/27/2009                                 CONSULTATION   REASON FOR CONSULTATION:  Mental status changes, evaluate, recommend  treatment, assess capacity.   REQUESTING PHYSICIAN:  [facility].   HISTORY OF PRESENT ILLNESS:  Mr. Mateusz Neilan is a 76 year old male  admitted to the [facility] on February 18, 2009, due to syncope,  dehydration and orthostasis.  Mr. Orihuela was unresponsive for an unknown  period of time prior to admission.  He did have rhabdomyolysis.  Also  his Depakote level was undetectable.  Mr. Dinkins continues to demonstrate  impaired judgment.  His mental status changes have been occurring for at  least 7 days although the duration is not clear.  He also is  demonstrating impaired memory.  He has difficulty with time orientation.  He also has poor appreciation for his medical problems and risks.  He is  not combative.  He is calm.  He is cooperating with bedside care.  His  mood does not appear to be depressed.  There is no evidence that he is  having any impulses to harm himself or others.  He does not appear to be  having any hallucinations or delusions.   PAST PSYCHIATRIC HISTORY:  History of cerebrovascular disease and  delirium.   PAST MEDICAL HISTORY:  He does have cerebrovascular disease with a  history of bilateral strokes.  He has hypertension, history of DVT,  chronic obstructive pulmonary disease, type 2 diabetes mellitus.  He has  undergone a hernia repair in the past.  Rule out seizure.   SOCIAL HISTORY:  Mr. Wierzbicki is single.  He is occupationally retired.  He  does not use alcohol or illegal drugs.  He has been living alone.   FAMILY  PSYCHIATRIC HISTORY:  None known.   MEDICATIONS:  The MAR is reviewed.  He is on Depakote 500 mg b.i.d.   LABORATORY DATA:  WBC 5.7, hemoglobin 12, platelet count 169, sodium  137, BUN 6, creatinine 0.84, glucose 95, Depakote level is now 67.6.  Ammonia level is still high at 38.  SGOT 31, SGPT 26.  His thyroid  stimulating hormone was mildly elevated at 5.343.  However, free T4 is  normal.  T3 is normal.  EKG QTC 425 milliseconds.  His MRI of the brain  without contrast showed no acute intracranial abnormality.  There were  multiple remote lacunar infarcts of the basal ganglia bilaterally.  He  had extensive white matter disease throughout both cerebral hemispheres,  age advanced atrophy.   REVIEW OF SYSTEMS:  Constitutional, head, eyes, ears, nose, throat,  mouth, neurologic, psychiatric, cardiovascular, respiratory,  gastrointestinal, genitourinary, skin, musculoskeletal, hematologic,  lymphatic, endocrine and metabolic remarkable.   PHYSICAL EXAMINATION:  VITAL SIGNS:  Temperature 97.9, pulse 77,  respiratory  rate 20, blood pressure is 137/86, O2 saturation on 2 liters  93%.  GENERAL APPEARANCE:  Mr. Mojica is an elderly male lying in a supine  position in his hospital bed with no abnormal involuntary movements.  MENTAL STATUS EXAM:  Ms. Stecher is showing some mild clouding of  consciousness.  His attention span is decreased.  His concentration is  decreased.  His affect is mildly flat.  His mood is also flat.  On  orientation testing he does not know the month.  On memory testing 3/3  immediate, 0/3 on recall.  His fund of knowledge and intelligence are  below that of his estimated premorbid baseline.  His speech involves a  slowing and a mildly flat prosody.  Thought process involves decreased  speed.  Thought content - no thoughts of harming himself or others.  No  delusions or hallucinations.  His insight is poor.  Judgment is  impaired.   ASSESSMENT:  AXIS I:  293.00  delirium not otherwise specified.  His  ammonia is only slightly elevated.  As mentioned by neurology the  ammonia can sometimes be elevated by Depakote distinct from a Depakote  induced hepatitis.  This elevation of ammonia might be enough to impair  his mental status acutely given his baseline of cerebrovascular disease  and his age.  However, checking other etiologies would be appropriate.  AXIS II:  None.  AXIS III:  See past medical history.  AXIS IV:  General medical primary support group.  AXIS V:  30.   Mr. Sides does demonstrate critical impairments in judgment, reasoning  and memory.  He does not have the capacity for informed consent  regarding choosing his environment after discharge.   RECOMMENDATIONS:  The undersigned concurs with Mr. Carmer needing 24-hour  per day supervision.  Would also check a B12, folic acid and RPR.  Would  consider memory enhancing agents such as Aricept, anticholinesterase  inhibitors have shown some benefit at times in cerebrovascular dementia,  if his memory impairment continues as a baseline.      Antonietta Breach, M.D.  Electronically Signed     JW/MEDQ  D:  03/01/2009  T:  03/01/2009  Job:  [id number]

## 2010-11-10 NOTE — Procedures (Signed)
EEG:  10 - 1016.   CLINICAL HISTORY:  The patient is a 76 year old admitted with syncope,  dehydration, orthostatic hypertension, fell found on the floor poorly  arousable.  The patient has generalized weakness and is unable to  remember the episode.  He has had waxing and waning mental status during  this hospitalization.  Study is being done to look for the presence of  an etiology for his altered mental status (293.0).   PROCEDURE:  The tracing is carried out on a 32-channel digital Cadwell  recorder reformatted into 16 channel montages with one devoted to EKG.  The patient was awake, but lethargic.  The International 10/20 system  lead placement was used.   Medications include aspirin, Depakote, Lovenox, Neurontin, NovoLog,  Normodyne, Protonix, Tylenol, and Zofran.   DESCRIPTION OF FINDINGS:  The dominant frequency is a 40 microvolt 7 Hz  theta range activity.  The background is a broadly distributed mixed  frequency theta and delta range activity with delta range components of  2-4 Hz and 60 microvolts.   The background was monotonous and did not significantly change during  the recording.  There was no focal slowing.  There was no interictal  epileptiform activity in the form of spikes or sharp waves.   EKG showed a regular sinus rhythm with ventricular response of 84 beats  per minute.   IMPRESSION:  Abnormal electroencephalogram on the basis of diffuse  background slowing.  This is a nonspecific indicator of neuronal  dysfunction that maybe on a primary degenerative basis or secondary to  variety of toxic or metabolic etiologies.  There is no seizure activity  in this record.      Deanna Artis. Sharene Skeans, M.D.  Electronically Signed     WUJ:WJXB  D:  02/21/2009 20:15:44  T:  02/22/2009 06:57:14  Job #:  [id number]

## 2010-11-10 NOTE — Discharge Summary (Signed)
[name], [name] NO.:  [id number]   MEDICAL RECORD NO.:  [id number]          PATIENT TYPE:  INP   LOCATION:  5511                         FACILITY:  [facility]   PHYSICIAN:  Lonia Blood, M.D.       DATE OF BIRTH:  [birth year]   DATE OF ADMISSION:  02/19/2009  DATE OF DISCHARGE:  02/27/2009                               DISCHARGE SUMMARY   PRIMARY CARE PHYSICIAN:  Dr. Tillman Abide with [facility].   DISCHARGE DIAGNOSES:  1. Altered mental status - persistent delirium versus early dementia.  2. Hypertension.  3. History of type B aortic dissection.  4. Mild diabetes mellitus type 2.  5. Probable partial seizures.  6. Chronic obstructive pulmonary disease.  7. Probable syncope prior to admission.  8. Dehydration, resolved.  9. History of lacunar strokes.  10.Subclinical hypothyroidism.  11.Borderline low B12 level.  12.Weakness, deconditioning and gait disorder.   DISCHARGE MEDICATIONS:  1. Sliding scale insulin with each meal three times a day.  2. Labetalol 100 mg by mouth three times a day.  3. Depakote 250 mg twice a day.  4. Aspirin 81 mg daily.  5. Levothyroxine 25 mcg daily.  6. Vitamin B12 at 1000 mcg daily.  7. Lactulose 30 mL daily, hold for severe diarrhea.  8. Albuterol MDI 1 puff three times a day as needed.   CONDITION ON DISCHARGE:  [name] is going to be transferred to a  skilled nursing facility for short-term rehabilitation.   CONSULTATIONS ON ADMISSION:  The patient was seen in consultation by  neurology service and psychiatry.   PROCEDURES DURING THIS ADMISSION:  1. The patient underwent MRI of the brain without intravenous contrast      which was negative for acute infarcts.  The study was positive for      multiple remote lacunar infarcts in the basal ganglia bilaterally      with atrophy of the brain which is advanced for age.  2. EEG which was interpreted as abnormal EEG on the basis of diffuse      background  slowing, nonspecific indicator neuronal dysfunction,      degenerative basis rather than epileptiform activity.  No      interictal epileptiform activity was seen.   HISTORY AND PHYSICAL:  Refer to dictated H and P done by Dr. Ramiro Harvest on February 19, 2009.   HOSPITAL COURSE:  [name] is a 76 year old gentleman with multiple  medical problems who was brought to the emergency room by his family  after he was found confused at his house.  There was a question of a  syncopal event prior to admission.  The patient was placed on telemetry  and had cardiac enzymes checked and telemetry monitoring for 3 days.  No  arrhythmias or indication of myocardial injury was found.  Even though  his initial CK was elevated, his CK-MB was within normal limits and  troponin were within normal limits not suggesting myocardial injury, but  rather mild rhabdomyolysis.  The patient had a recent cardiac  echocardiogram which indicated  an ejection fraction 55-60%, and grade 1  diastolic dysfunction, not enough to concern [place] for ventricular  arrhythmias.  The patient had also an MRI of the brain which did not  indicate acute stroke.  He also had neurological consultation, EEG  testing and overall it was felt that it was less likely that he had a  seizure.  Our attention was turned towards the patient's mental status  which seemed to be one of either persistent delirium versus dementia.  The MRI of the brain suggested age advanced atrophy, but in talking to  the family they did could concur that the patient may have had problems  with his IADLs prior to admission.  An extensive workup to look for  causes of this delirium was undertaken and we found out that the patient  had a borderline elevated TSH, but with normal free T4.  Nevertheless,  due to his symptomatology he was started on low-dose Synthroid.  The  patient also has a borderline low level of B12, but I do not think this  is enough to explain his  mental status.  We discontinued the Neurontin  and decreased the dose of Depakote to see if that will make a difference  in the patient's mental status.  Overall myself, the consulting  neurologist, the psychiatrist feel the patient may actually have early  dementia.  For now, [name] will be transferred to a skilled nursing  facility to undergo short-term rehabilitation to see if his gait  disorder related to his multiple old strokes in the basal ganglia could  be improved.  The safety of discharging home to live alone is going to  be dependent on the future hoped improvements in mental status and  balance.      Lonia Blood, M.D.  Electronically Signed     SL/MEDQ  D:  02/27/2009  T:  02/27/2009  Job:  [id number]   cc:   Karie Schwalbe, MD

## 2010-11-10 NOTE — Discharge Summary (Signed)
[name], MAGGART NO.:  [id number]   MEDICAL RECORD NO.:  [id number]          PATIENT TYPE:  INP   LOCATION:  5127                         FACILITY:  [facility]   PHYSICIAN:  Rosalyn Gess. Norins, MD  DATE OF BIRTH:  December 31, 1934   DATE OF ADMISSION:  01/17/2009  DATE OF DISCHARGE:  01/19/2009                               DISCHARGE SUMMARY   ADMITTING DIAGNOSES:  1. Involuntary movement.  2. Hypertension.  3. Diabetes.  4. Chronic obstructive pulmonary disease.   DISCHARGE DIAGNOSES:  1. Involuntary movement.  2. Hypertension.  3. Diabetes.  4. Chronic obstructive pulmonary disease.   CONSULTATIONS:  Melvyn Novas, MD, Neurology.   PROCEDURES:  CT of the brain performed on January 17, 2009 which showed no  acute findings.  Atrophy and extensive chronic microvascular ischemic  changes noted.   HISTORY OF PRESENT ILLNESS:  Mr. Cubero followed by Dr. Tillman Abide  for primary care and by the Cardiology Service presented with onset of  generalized tremors and muscle twitching that appeared the night prior  to admission.  These muscle movements have continued to increase in  amplitude.  He denied any pain or discomfort.  He states the movement is  nonrhythmic or periodic.  He does not show synchrony.  Because of his  persistent symptoms, he was admitted to hospital for evaluation and  consultation by Neurology.  Please see EMR generated H and P for past medical history, family  history, social history, physical exam at admission and medications.   HOSPITAL COURSE:  The patient was seen and evaluated by Dr. Porfirio Mylar  Dohmeier.  She recommended serum toxin screen, CBC, and liver function  studies.  These studies were normal.  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  Dr. Vickey Huger did recommend starting Depakote  500 mg b.i.d. and low-dose Neurontin at 100 mg t.i.d.   The patient's laboratories returned as normal with a negative drug  screen.   Ammonia level was normal at 24.  Comprehensive metabolic panel  was unremarkable with normal creatinine of 1.11 and BUN was normal at  15.  Electrolytes were normal.  Liver functions were normal.   The patient did do well on a regimen of IV Depakote and this was  switched to p.o. Depakote on January 18, 2009.  He continued do well with  no recurrent tremors or myoclonic movement.  With the patient being  stable with the laboratory being negative with CT of the brain being  unremarkable with Dr. Vickey Huger feeling that the patient did not have any  stroke, at this point he is stable and ready for discharge to home.   DISCHARGE PHYSICAL EXAMINATION:  VITAL SIGNS:  Temperature 97.5, blood  pressure 118/75, heart rate 56, respirations were 19, and O2 sats 96% on  room air.  GENERAL APPEARANCE:  This is a well-nourished, well-developed gentleman  in no acute distress.  PULMONARY:  The patient has no increased work of breathing.  No  wheezing.  CARDIOVASCULAR:  2+ radial pulses.  Precordium was quiet.  He had a  regular rate and rhythm.  NEUROLOGIC:  The patient is awake, alert, and oriented to person, place,  time, and context.  Cranial nerves were grossly intact with normal  facial symmetry.  Extraocular muscles were intact.  The patient has  normal motor strength and moving all extremities spontaneously.   LABORATORY:  As noted.   DISCHARGE MEDICATIONS:  The patient will continue all of his home  medications as outlined in the admit note.  We will add Depakote 500 mg  p.o. b.i.d. and Neurontin 100 mg t.i.d.   DISPOSITION:  The patient is to see Dr. Tillman Abide, his primary care  physician, on Monday, January 20, 2009.  The patient should have a followup  with Dr. Vickey Huger and I will ask Dr. Alphonsus Sias and his office staff to  arrange the same.   CONDITION AT TIME OF DISCHARGE DICTATION:  Medically stable.      Rosalyn Gess Norins, MD  Electronically Signed     MEN/MEDQ  D:  01/19/2009   T:  01/20/2009  Job:  [id number]   cc:   Melvyn Novas, M.D.  Karie Schwalbe, MD

## 2010-11-10 NOTE — Consult Note (Signed)
NAMEXZAYVION, VAETH NO.:  [id number]   MEDICAL RECORD NO.:  [id number]          PATIENT TYPE:  INP   LOCATION:  5511                         FACILITY:  [facility]   PHYSICIAN:  Deanna Artis. Hickling, M.D.DATE OF BIRTH:  07/16/1934   DATE OF CONSULTATION:  02/21/2009  DATE OF DISCHARGE:                                 CONSULTATION   CHIEF COMPLAINT:  Altered mental status.   HISTORY OF PRESENT CONDITION:  This is a 76 year old gentleman seen  previously for myoclonic activity, January 15, 2009.  This was thought to  be myoclonic in nature.  I do not see an EEG in the transcribed notes  and therefore, do not know how a decision was made that the patient  might have seizures and how he was placed on antiepileptic medication.   The patient was found down at home unresponsive for an unknown period of  time.  He had significant rhabdomyolysis without azotemia.  His Depakote  level was nondetectable.  He had his head CT scan which showed evidence  of atrophy.  MRI scan has since been done and shows severe atrophy,  dilated prevascular spaces throughout the diencephalon, evidence of  bilateral strokes within the thalamus right greater than left, and  significant cortical atrophy.  There was no evidence of acute stroke.  The region of the basal ganglia is also evidence of a prior small  hemorrhage.   The patient has history of hypertension, type B aortic dissection, type  2 diabetes mellitus, DVT in the past, and has had problems with  movements order, COPD.   The patient's past surgical history includes herniorrhaphy in December  2005, which was complicated by DVT, phlebitis in the right upper  extremity secondary to IV infiltration, history of chronic cough.   His home medications included multivitamin, aspirin 81 mg daily,  labetalol 400 mg three times daily, Depakote 500 mg twice daily,  Neurontin 100 mg three times daily, Diovan 160 mg daily, and Norvasc 10  mg  daily.   SOCIAL HISTORY:  The patient does not use tobacco, alcohol or drugs.  He  lives alone.   FAMILY HISTORY:  Unknown and thought to be noncontributory for this  acute illness.   The patient's CK was elevated at 954.  His creatinine was 1.12, BUN was  15.   The patient was hydrated and antiepileptic drugs were started.  He  received a loading dose of 1000 mg yesterday and 500 mg today.  This  afternoon, he is obtunded.  I was asked to see him to determine the  etiology of his altered mental status and to make recommendations for  further workup and treatment.   His 12-system review was negative except as noted above, but he is  unable to provide any further information to me because of his  obtundation.   PHYSICAL EXAMINATION:  VITAL SIGNS:  Blood pressure 136/83, resting  pulse 88, respirations 22, temperature 99.1, oxygen saturation 94%.  GENERAL:  The patient is lethargic, I can arouse him.  He is able to say  a few words.  When we noxious stimuli to arouse him, he becomes somewhat  more vigorous both what he says to me and how he responds.  HEAD, EYES, EARS, NOSE AND THROAT:  No signs of infection.  NECK:  Supple.  No bruits.  LUNGS:  Clear.  HEART:  No murmurs.  ABDOMEN:  Bowel sounds normal.  No hepatosplenomegaly.  EXTREMITIES:  No edema.  NEUROLOGIC:  Mental status; the patient was obtunded.  He is able to say  a few words.  He barely followed any commands.  Cranial nerves;  difficult to see he was tightly closing his eyes.  He has reactive  pupils.  He has positive red reflex.  I saw brief glimpses of the  retina, but not enough to be certain about it.  I cannot test visual  fields because of his eyelid closure.  Neither can I test corneal  response.  He has a good grimace and a good gag.  Motor examination; the  patient is able to move all four extremities.  With noxious stimuli, he  shows good power in all four.  He does not show any asymmetry with his  fine  motor skills, although there was a very little movements of his  digits.  Deep tendon reflexes were present, but virtually absent in the  upper extremities, diminished at the knees.  He had bilateral flexor  plantar responses and he was areflexic at the ankles.   Sensation withdrawal x4.   IMPRESSION:  Delirium, unknown etiology. (293.0) I have in some cases  seen that this caused by Depakote.  For that reason, we will check an  ammonia levels as well as valproic acid level.  We need to look at his  EEG to make certain that he is not having seizures.  Given that this was  not done previously.  We will also check arterial blood gas.  I think  that this patient likely has an underlying dementia from the degree of  diffuse atrophy, white matter disease, and by thalamic strokes.   Superimposed upon this is a delirium of unknown etiology.  I appreciate  the opportunity to participate in his care and we will follow him along  with you.      Deanna Artis. Sharene Skeans, M.D.  Electronically Signed     [facility]/MEDQ  D:  02/21/2009  T:  02/22/2009  Job:  [id number]   cc:   Stacie Glaze, MD

## 2010-11-12 ENCOUNTER — Ambulatory Visit (INDEPENDENT_AMBULATORY_CARE_PROVIDER_SITE_OTHER): Payer: Medicare Other | Admitting: Internal Medicine

## 2010-11-12 DIAGNOSIS — E538 Deficiency of other specified B group vitamins: Secondary | ICD-10-CM

## 2010-11-12 MED ORDER — CYANOCOBALAMIN 1000 MCG/ML IJ SOLN
1000.0000 ug | Freq: Once | INTRAMUSCULAR | Status: AC
  Administered 2010-11-12: 1000 ug via INTRAMUSCULAR

## 2010-11-12 NOTE — Progress Notes (Signed)
  Subjective:    Patient ID: [name], male    DOB: Mar 29, 1935, 76 y.o.   MRN: [id number]  HPI  Here for B12 injection  Review of Systems     Objective:   Physical Exam        Assessment & Plan:

## 2010-11-13 NOTE — Assessment & Plan Note (Signed)
[facility]                               PULMONARY OFFICE NOTE   KYPTON, ELTRINGHAM                           MRN:          [id number]  DATE:04/14/2006                            DOB:          June 24, 1935    PULMONARY/FINAL FOLLOWUP OFFICE VISIT   HISTORY:  This is a 76 year old white male former smoker who previously told  me he couldn't get dressed to come to the doctor without giving out due to  dyspnea who says he is only a little bit better today.  I thought his  symptoms were markedly disproportionate to objective disease and was also  concerned about the possibility of adverse drug effect or heart failure.  I  brought him back for full inventory of medications through our nurse  practitioner which included the use of empiric Prevacid daily (he had  previously had pseudowheeze that I thought was related to ACE inhibitors  sometimes is seen in patients with occult reflux) and today he reports no  specific activity where he is truly limited due to dyspnea (including  dressing) but still not satisfied he is back where he started before his  exacerbation of dyspnea in April 2007.  However, when I asked him a specific  activity that he would like to do that he is limited from doing from  breathing, he could not name a single one.   PHYSICAL EXAMINATION:  GENERAL:  He is a pleasant ambulatory white male in  no acute distress.  He has stable vital signs.  HEENT:  Unremarkable, pharynx clear.  LUNG FIELDS:  Reveal diminished breath sounds bilaterally but no wheezing.  HEART:  Regular rhythm without murmur, gallop, rub.  ABDOMEN:  Soft, benign.  EXTREMITIES:  Warm without calf tenderness, cyanosis, clubbing.   PFTs were reviewed with the patient today indicating FEV1 of 77% of  predicted; however, he has an FEV1:FVC percent of 45 and had a diffusing  capacity of only 41%.   IMPRESSION:  This patient has emphysematous chronic obstructive pulmonary  disease that is more severe than I had previously anticipated but yet has  improved clinically off of ACE inhibitors and on PPI therapy to the point  where he could not name a single activity he would like to do but cannot  because breathing limits him.   Having said this, the patient was an exceptionally difficult historian  initially and I had a great deal of difficulty sorting through his symptoms,  and even now am not sure that we are really on the same page in terms of  his symptoms and his limitations.   I did spend extra time teaching him how to use Spiriva (which he mastered to  75%-plus effectiveness) and asked him to try to see if he notices  significant improvement to the point where he returns to baseline in terms  of functional status.  If not, I would simply leave it off and use a drug  like Combivent p.r.n.   However, regular pulmonary followup at this point is not needed.  ______________________________  Charlaine Dalton Sherene Sires, MD, [facility]      MBW/MedQ  DD:  04/14/2006  DT:  04/17/2006  Job #:  [id number]   cc:   Karie Schwalbe, MD

## 2010-11-13 NOTE — H&P (Signed)
NAMEMACALISTER, ARNAUD NO.:  [id number]   MEDICAL RECORD NO.:  [id number]          PATIENT TYPE:  INP   LOCATION:                               FACILITY:  [facility]   PHYSICIAN:  Gordy Savers, M.D. LHCDATE OF BIRTH:  04/28/1935   DATE OF ADMISSION:  11/13/2004  DATE OF DISCHARGE:                                HISTORY & PHYSICAL   The patient is a 76 year old gentleman who presents to the emergency  department with chest and back pain.  He was stable until the day of  admission when while doing yard work he had the onset of severe back,  shoulder, and chest pain.  This was described as sharp and severe.  He  presented to the emergency department where a CT angiogram revealed a  dissection of his descending aorta.  This extended from the origin of the  left subclavian down to the iliac.  He is now admitted for further  evaluation and treatment of his dissecting descending aorta.   PAST MEDICAL HISTORY:  Fairly unremarkable.  1.  He was hospitalized in late December 2005, for a hernia repair.  This      was complicated by DVT of the lower extremities.  He has been on      Coumadin 10 mg alternating with 5 mg daily.  2.  He has a history of COPD on Advair and Combivent.   He otherwise takes no medications.   SOCIAL HISTORY:  He is married, nondrinker, nonsmoker.   No prior history of hypertension.   REVIEW OF SYSTEMS:  Otherwise noncontributory and stable until the onset of  his acute illness.   PHYSICAL EXAMINATION:  GENERAL:  Revealed a well developed male who was  alert, although slightly sedated from morphine.  He was in no acute  distress.  VITAL SIGNS:  Blood pressure was 140/80 in both arms.  HEAD/NECK:  Revealed pupillary responses.  Conjunctivae clear.  Oropharynx  clear.  There is no facial asymmetry. Neck revealed a normal carotid  upstroke bilaterally.  There is no neck vein distention.  CHEST:  Clear.  CARDIOVASCULAR:  Revealed normal S1  and S2.  Heart sounds were fairly  distinct.  ABDOMEN:  Soft and nontender.  PULSES:  Revealed symmetrical radial and femoral pulses.  He did have a loud  left femoral bruit.  The patient's dorsalis pedis and posterior tibial  pulses were also equal and symmetrical.  NEURO:  Nonfocal.   IMPRESSION:  1.  Dissection of the descending aorta at the origin of the left subclavian      artery to the iliac arteries.  2.  Chest and back pain secondary to number one.   DISPOSITION:  1.  The patient will be admitted to the medical intensive care unit.  2.  His blood pressure will be monitored closely and controlled with IV      Esmolol.  He was given a bolus of 500 mcg over one minute in the      emergency department and placed on an infusion of 50 mcg/kg/min.  This  will be titrated to maintain a systolic blood pressure from 110-130.  3.  He will receive vitamin K to reverse the Coumadin effect which is in a      low therapeutic range.  4.  A 2D echocardiogram will be reviewed.  5.  The patient will be treated for pain with parenteral narcotics.  6.  He will also be treated with prophylactic Protonix.       ___________________________________________  Gordy Savers, M.D. [facility]    PFK/MEDQ  D:  11/13/2004  T:  11/14/2004  Job:  [id number]

## 2010-11-13 NOTE — Assessment & Plan Note (Signed)
[facility]                               PULMONARY OFFICE NOTE   [name], [name]                           MRN:          [id number]  DATE:01/31/2006                            DOB:          [birth year]    HISTORY OF PRESENT ILLNESS:  A 76 year old, white male seen at Dr. Karle Starch  request on June 20, for evaluation of dyspnea which I thought was probably  vocal cord dysfunction precipitated by ACE inhibitors.  He returns today  having eliminated ACE inhibitors in favor of Diovan 160 one daily on an  empiric basis and better, but not completely well in terms of symptoms of  both coughing and dyspnea.  He denies any chest pain, previous shortness of  breath, orthopnea, PND, leg swelling or nocturnal complaints of variability  in terms of his dyspnea.   PHYSICAL EXAMINATION:  GENERAL:  He is a pleasant, ambulatory, slightly  hoarse, white male in no acute distress.  VITAL SIGNS:  Stable vital signs with a blood pressure of only 98/64.  HEENT:  Unremarkable.  Oropharynx is clear.  LUNGS:  Lung fields reveal prominent pseduowheeze.  CARDIAC:  Regular rhythm without murmurs, rubs or gallops.  ABDOMEN:  Soft, benign.  EXTREMITIES:  No calf tenderness, clubbing, cyanosis or edema.   Chest x-ray shows no infiltrates or effusions.  PFTs were requested, but not  done.   ASSESSMENT/PLAN:  This patient continues to have prominent pseudowheeze that  was not eliminated by angiotensin-converting enzyme inhibitors and has  relatively low blood pressure on a triple combination medical therapy.  The  problem is that the patient was not able to name any of his medications  individually indicating to me that it may be problematic titrating his  medications as an outpatient, but nevertheless, I recommend the following:   1.  I asked him to take the pill he takes three times a day (which is      labeled as labetalol in our records) and take it a half a pill three     times a day because he is relatively bradycardic and hypotensive today.  2.  I have asked him to continue Diovan in the place of lisinopril.  3.  I have added Prevacid 30 mg taken before breakfast daily for the next 6      weeks and then return here for a set of PFTs and a full medication      reconciliation.  If his upper airway wheezing is not resolved at that      point, we may need to consider taking a look at the upper airway to be      sure there is not a mechanical cause.   ADDENDUM   Parenthetically, I would note that many patient's that cannot tolerate ACE  inhibitors fail to do so because of a second culprit problem such that is  contributing to upper airway inflammation.  In  this case, I am hopeful that something simple like reflux is the cause.  We  will see when he returns.  [name], MD, [facility]   MBW/MedQ  DD:  01/31/2006  DT:  02/01/2006  Job #:  [id number]   cc:   Karie Schwalbe, MD

## 2010-11-13 NOTE — Assessment & Plan Note (Signed)
[facility]                               PULMONARY OFFICE NOTE   [name], [name]                           MRN:          [id number]  DATE:03/01/2006                            DOB:          1935-04-21    HISTORY OF PRESENT ILLNESS:  This is a 76 year old white male patient of Dr.  Thurston Hole that presents for a 47-month followup.  The patient was being  evaluated for persistent shortness of breath with activity and dry cough.  His ACE inhibitor has been discontinued and the patient was placed on  Prevacid daily.  The patient reports he has had improvement in his cough,  but it has not totally resolved.  He continues to have shortness of breath  with activity.  The patient denies any purulent sputum, fever, chest pain,  orthopnea, PND or leg swelling.  The patient's labetalol was decreased at  last visit and has been tolerating well.   PHYSICAL EXAM:  GENERAL:  The patient is a pleasant male in no acute  distress.  VITAL SIGNS:  He is afebrile with stable vital signs.  HEENT:  Unremarkable.  NECK:  Supple without adenopathy.  CHEST:  Lung sounds reveal diminished breath sounds at the bases.  CARDIAC:  Regular rate and rhythm.  ABDOMEN:  Soft and nontender.  EXTREMITIES:  Warm, without any edema.   IMPRESSION AND PLAN:  1. Chronic cough in a former smoker.  The patient's symptoms did improve      somewhat off of his ACE inhibitor and with reflux preventative      measures, however, has not totally resolve.  The patient will return      with Dr. Sherene Sires to discuss possibly undergoing a bronchoscopy for further      evaluation.  2. Complex medication regimen.  The patient's medications were reviewed in      detail.  The patient is scheduled for Friday.  A computerized      medication calendar was completed for this patient and reviewed in      detail.  The patient is aware to bring this back to each and every      visit.  3. Hypertension.  The patient's  blood pressure does seem to be under      adequate control with his recent antihypertensive medication changes.      To note, the patient does have a history of a nonoperative aneurysm and      will need to continue to monitor blood pressures closely.                                   Rubye Oaks, NP                                [name], MD, [name]   TP/MedQ  DD:  03/01/2006  DT:  03/02/2006  Job #:  [id number]

## 2010-11-13 NOTE — Consult Note (Signed)
[name], Sosa NO.:  [id number]   MEDICAL RECORD NO.:  [id number]          PATIENT TYPE:  INP   LOCATION:  2316                         FACILITY:  [facility]   PHYSICIAN:  Carole Binning, M.D. LHCDATE OF BIRTH:  May 07, 1935   DATE OF CONSULTATION:  11/18/2004  DATE OF DISCHARGE:                                   CONSULTATION   CARDIOLOGY CONSULTATION   REASON FOR CONSULTATION:  Hypertensive control in patient with type B aortic  dissection.   HISTORY OF PRESENT ILLNESS:  The patient is a 76 year old male without prior  cardiac history.  He denies any history of chest pain or cardiac disease.  He does have a history of chronic obstructive pulmonary disease treated with  inhalers.  He also has a history of a deep venous thrombosis and has been on  chronic Coumadin therapy.  On Nov 13, 2004 while he was working in his yard  he developed the onset of severe chest, shoulder and back pain.  It was  sudden in onset and quite sharp in nature.  He presented to the emergency  room where a CT scan angiogram revealed a type B aortic dissection extending  from the left subclavian artery through the thoracic and abdominal aorta and  into the iliac arteries.  The dissection extended into the right common  femoral artery as well.  There were noted to be two left renal arteries, the  upper most smaller left renal artery was associated with a false lumen, the  larger lower left renal artery was associated with a true lumen.  There was  a single right renal artery which fills via the true lumen.  The celiac and  superior mesenteric arteries fill via the true lumen.  The patient was  admitted by the internal medicine service to the surgical intensive care  unit.  He has been managed medically for his type B dissection with blood  pressure control.  He has been treated with intravenous esmolol and  nitroglycerin.  He has also been started on oral agents for his hypertension  which have included Clonidine, Norvasc and Lopressor.  Despite these oral  agents he is still requiring intravenous therapy for his hypertension and  thus cardiology is consulted.   Currently the patient denies any pain at this time.  However, he is quite  lethargic.  He denies any prior history of hypertension, diabetes or  hyperlipidemia.  He does have a previous history of tobacco use but quit  several years ago.   PAST MEDICAL HISTORY:  1.  Hernia repair in December 2005 complicated by lower extremity deep      venous thrombosis.  He has been on chronic Coumadin since then.  2.  History of chronic obstructive pulmonary disease on Advair and Combivent      inhalers.  He was otherwise on no medications prior to admission.   CURRENT MEDICATIONS:  His current medications include intravenous esmolol  drip, intravenous nitroglycerin drip, Lopressor 100 mg b.i.d., Clonidine,  which was just increased to 0.3 mg b.i.d., Norvasc 10 mg daily,  Protonix 40  mg daily, Amaryl 2 mg daily, sliding scale insulin and PRN Clonidine.   SOCIAL HISTORY:  The patient is married.  He denies alcohol abuse. He does  have a history of previous tobacco use but quit several years ago.   FAMILY HISTORY:  Noncontributory.   REVIEW OF SYMPTOMS:  Is otherwise as noted in the Physician's Assistant hand-  written consultation note.   PHYSICAL EXAMINATION:  GENERAL:  This is a lethargic, but otherwise well-  appearing male in no acute distress.  CURRENT VITAL SIGNS:  Blood pressure 137/61, pulse 59 and regular,  respirations 19, oxygen saturation 96% on 2L nasal cannula.  HEENT:  Normocephalic, atraumatic.  Sclerae anicteric.  Oral mucosa  unremarkable.  NECK:  No lymphadenopathy or thyromegaly. No jugular venous distention.  Carotid upstroke is normal without bruit.  CHEST:  Clear to auscultation  and percussion.  CARDIAC:  PMI nondisplaced.  Distant heart sounds.  Regular rate and rhythm.  No rub, murmurs or  S3 appreciated.  ABDOMEN: Soft, nontender with diminished bowel sounds.  No audible bruit.  EXTREMITIES:  No clubbing, cyanosis or edema.  Distal pulses are 2+.   LABORATORY DATA:  Data is as documented in the handwritten note with  hemoglobin of 12.8, hematocrit 37.5.  electrolytes are notable for a low  potassium at 2.9 which has been supplemented.  His BUN is 8.  Creatinine is  0.8.  Cardiac enzymes have been negative.  Total cholesterol is 164,  triglycerides 221, HDL low at 21, LDL 99.   CT scan angiography is as described above.   Echocardiogram from Nov 16, 2004 reveals overall normal left ventricular  systolic function with ejection fraction of 55 to 65%.  There was mild  mitral regurgitation with mild left atrial dilatation.  No aortic  insufficiency or pericardial effusion was noted.   Electrocardiogram on presentation revealed sinus rhythm with nonspecific ST  abnormalities.   ASSESSMENT:  The patient is a 76 year old male without a prior documented  history of hypertension.  He presents with an acute type B aortic dissection  which extends from the subclavian artery to the iliac's as well as into the  right femoral artery.  The patient has been managed medically by the  internal medicine service and appears to be relatively hemodynamically  stable at this time but is requiring a significant number of  antihypertensives.   The patient is somewhat lethargic and in reviewing hospital notes it does  report he has been lethargic over the past few days.  The question is  whether this may be related to relative cerebral hypoperfusion in a patient  who may have baseline undiagnosed sustained hypertension.   RECOMMENDATIONS:  1.  Regarding his current medical therapy:  I would recommend discontinuing      the Lopressor and using labetalol for a more effective antihypertensive     effect.  Will start the patient on labetalol 200 mg b.i.d.  This can be      titrated upward as  needed to a total of 400 mg b.i.d.  With institution      of the labetalol, would wean the esmolol as tolerated, keeping target      systolic blood pressure of less than 150.  Would also attempt to wean      the nitroglycerin once we have weaned off the esmolol.  Would also      recommended adding ACE inhibitor, specifically captopril 6.25 mg q.8h.  and titrating this upwards as indicated.  We need to be careful with the      oral antihypertensive therapy not to drop the patient's blood pressure      excessively low and thus he will need to be followed very closely.  2.  Regarding the overall management of the patient's extensive aortic      dissection, I would recommend consulting cardiovascular surgery so that      they are involved in the case should any indications for surgical repair      arise.   I thank you for consulting [place] on this challenging patient.  We will follow  him with you.      MWP/MEDQ  D:  11/18/2004  T:  11/18/2004  Job:  [id number]

## 2010-11-13 NOTE — Discharge Summary (Signed)
[name], [name] NO.:  [id number]   MEDICAL RECORD NO.:  [id number]          PATIENT TYPE:  INP   LOCATION:  2901                         FACILITY:  [facility]   PHYSICIAN:  Rene Paci, M.D. LHCDATE OF BIRTH:  08-12-1934   DATE OF ADMISSION:  11/13/2004  DATE OF DISCHARGE:  11/24/2004                                 DISCHARGE SUMMARY   DISCHARGE DIAGNOSES:  1. Acute type B aortic dissection with hypertensive urgency, stable.  No      evidence of end organ damage.  Continue blood pressure control.  2. Type 2 diabetes.  Hemoglobin A1c is 6.0.  3. History of deep venous thrombosis greater than six weeks.  Discontinue      Coumadin secondary to #1.  Not a candidate for internal vena cava      filter per Dr. Andee Lineman.  4. Phlebitis right forearm secondary to IV infiltration, continue empiric      Keflex.  5. History of chronic obstructive pulmonary disease with history of      tobacco abuse, quit 25 years ago.  May continue Advair as needed.     DISCHARGE MEDICATIONS:  1. Discontinuation of Coumadin.  2. Keflex 500 mg p.o. q.i.d. x6 additional days.  3. Amaryl 2 mg p.o. q.a.m.  4. Norvasc 10 mg p.o. daily.  5. Clonidine 0.3 mg p.o. b.i.d.  6. Altace 5 mg p.o. b.i.d.  7. Labetalol 400 mg p.o. t.i.d.     FOLLOW UP:  This week in 48 hours with primary care physician, Dr. Tillman Abide, for Thursday, November 26, 2004, at 2:45 p.m.  Patient is instructed to  keep tabs on his blood pressure for a goal systolic pressure less than 140  as well as maintain a diabetic diet, further education on diabetes will need  to be pursued as an outpatient as this has not been fully educated during  this hospitalization because of acute issues related to aortic dissection.   CONDITION ON DISCHARGE:  Medically stable.  Blood pressure 140/78 with a  heart rate of 78.   HOSPITAL COURSE BY PROBLEM:  PROBLEM #1 -  ACUTE TYPE B AORTIC DISSECTION:  Patient is a 76 year old  gentleman with history of clinical asthma who  presented to the emergency room the day of admission complaining of chest  and back pain.  By CT, he was found to have an acute descending aortic  dissection and was thus referred for admission and blood pressure control  without evidence of end organ damage.  He was not a surgical candidate and  goal was blood pressure management.  He was begun on Esmolol as well as  nitroglycerin and had resolution of his chest pain within the first 48  hours.  There was some difficulty weaning patient from his nitroglycerin and  Esmolol drip, thus cardiology was consulted to assist with this.  Medications were changed on an ongoing basis until we arrived at his current  regimen which has controlled his blood pressure for over 48 hours off the  drip without significant elevations in his systolic pressure.  He has been  asymptomatic.  CVTS service did evaluate patient on Nov 18, 2004, and agreed  that no subsequent medication was needed without evidence of end organ  damage, continue long term blood pressure goals, serial CT's per  recommendations.   PROBLEM #2 -  NEW DIAGNOSIS TYPE 2 DIABETES:  The patient was also noted to  have elevated CBGs during this hospitalization without previous history of  diabetes.  A1C was checked at 6.0 because of persisting CBGs in the 140s and  150s.  He was begun on low dose Amaryl 2 mg daily which he has tolerated  without problems or hypoglycemia.  His CBGs have ranged in the 90s to low  100s.  Continue Amaryl at discharge.  Continue further diabetic education as  an outpatient regarding risk factors for coronary disease.   PROBLEM #3 -  HISTORY OF DEEP VENOUS THROMBOSIS:  Patient was on Coumadin  prior to admission for a DVT following his hernia repair in December of  2005.  Because of recent DVT, consideration was given for an IVC filter as  anticoagulation was contraindicated in the setting of acute aortic  dissection,  however, cardiology felt that this was unwarranted as his DVT  was post surgical and thus not likely to be requiring anticoagulation at  this time.  Patient has been instructed to discontinue his anticoagulation  unless otherwise instructed by primary M.D.   DISCHARGE LABORATORY DATA:  Hemoglobin A1c of 6.0.  White count 9.8,  hemoglobin 12.7, platelets 414.  BMET normal with creatinine of 0.9.   Patient is symptom-free and stable for discharge.  Prescriptions for all of  his medications as listed above have been provided to patient at the time of  discharge.      VL/MEDQ  D:  11/24/2004  T:  11/24/2004  Job:  [id number]

## 2010-11-13 NOTE — Consult Note (Signed)
[name], BARICH NO.:  [id number]   MEDICAL RECORD NO.:  [id number]          PATIENT TYPE:  INP   LOCATION:  2316                         FACILITY:  [facility]   PHYSICIAN:  Salvatore Decent. Dorris Fetch, M.D.DATE OF BIRTH:  1934-08-08   DATE OF CONSULTATION:  11/18/2004  DATE OF DISCHARGE:                                   CONSULTATION   REASON FOR CONSULTATION:  Type B dissection.   CHIEF COMPLAINT:  Chest and back pain.   HISTORY OF PRESENT ILLNESS:  [name] is a 76 year old gentleman who  presented to the emergency room on Nov 13, 2004, with complaints of chest  and back pain.  A CT scan was done at that time which showed an aortic  dissection originating near his left subclavian artery and extending to the  iliacs.  He is admitted for blood pressure control.  His blood pressure has  been relatively difficult to manage and he has been on an Esmolol drip since  admission.  His back and chest pain resolved shortly after admission and  have not recurred.   PAST MEDICAL HISTORY:  Significant for hernia repair in December 2005  complicated by DVT, COPD, hypertension, remote history of tobacco abuse.   MEDICATIONS ON ADMISSION:  Coumadin for his DVT and inhalers, Advair for his  COPD.  He was also taking a multi-vitamin daily.   FAMILY HISTORY:  Noncontributory.   SOCIAL HISTORY:  He is married.  He has a past history of tobacco use, 1-2  packs daily, quit 25 years ago.   REVIEW OF SYMPTOMS:  We are unable to obtain at the present time due to the  patient's drowsiness.   PHYSICAL EXAMINATION:  GENERAL:  [name] is a lethargic 76 year old gentleman in no acute  distress.  VITAL SIGNS:  He is afebrile, blood pressure 135/67, pulse 60 and regular,  respirations 16, oxygen saturation 96% on 2 liters nasal cannula.  NEUROLOGICAL:  The patient is lethargic, he is arousable, he is nonfocal.  HEENT:  Unremarkable.  NECK:  Supple, no thyromegaly, adenopathy, or  bruits.  CARDIAC:  Regular rate and rhythm, normal S1 and S2, no rubs or murmurs.  LUNGS:  No wheezing.  ABDOMEN:  Soft.  EXTREMITIES:  No cyanosis, clubbing, and edema.  He has 1+ pulses in both  lower extremities as well as both radials.   LABORATORY DATA:  CT of the chest shows type B dissection extending from the  left subclavian to the iliacs.  His left renal fills off the true lumen, his  right renal fills off the false lumen.  White count 11.1, hematocrit 37,  platelets 274.  Sodium 137, potassium 2.9, BUN 8, creatinine 0.8, glucose  125.  LFTs within normal limits.  Albumin 3.6.  PT 16.3 with INR 1.5.   IMPRESSION:  [name] is a 76 year old gentleman with a type B aortic  dissection.  He is being managed appropriately medically.  There is no  indication for surgical intervention at the present time.  He has  approximately equal mortality with treatment either medically or  surgically.  The indication for surgery would be end organ hypoperfusion with the risks  to life or limb.  One kidney fills off the false lumen, however, his  creatinine is normal and that is not an indication for surgery.   RECOMMENDATIONS:  Continue medical management, agree with change to  Labetalol from Esmolol.  I am concerned about the patient's lethargy which  has been going on for several days in the absence of significant pain  medications usage during that time.  I would consider a head CT if his  mental status does improve.   The second consideration is this patient has a history of a fairly recent  DVT only six months ago and now is relatively immobile.  Anticoagulation is  contraindicated in the setting of acute dissection and  would give strong  consideration to an inferior vena cava filter in this gentleman.      SCH/MEDQ  D:  11/18/2004  T:  11/18/2004  Job:  [id number]   cc:   Rosalyn Gess. Norins, M.D. [facility]   Carole Binning, M.D. [facility]

## 2010-11-13 NOTE — Assessment & Plan Note (Signed)
[name]                               PULMONARY OFFICE NOTE   CARLOUS, [name]                           MRN:          [id number]  DATE:03/14/2006                            DOB:          [birth year]    HISTORY:  A 75 year old white male who returns for final follow-up pulmonary  visit complaining of dyspnea with minimal exertion, including getting  dressed to come to the doctor.  He denies any variability in terms of his  dyspnea with either weather/environmental change, fevers, chills, sweats,  associated subjective wheezing, chest pain, or leg swelling.   MEDICATIONS:  For full review of his medications, see column dated March 14, 2006.  Of note, this patient appears not to be able to read.  He could  name the first letters of his drugs, but not pronounce them.  He could not  read the word cough under Mucinex instructions.   PHYSICAL EXAM:  GENERAL:  He is a pleasant ambulatory white male in no acute  distress.  VITAL SIGNS:  Stable vital signs.  HEENT:  Unremarkable.  Oropharynx clear.  LUNGS:  Fields clear with diminished breath sounds bilaterally.  No  wheezing.  Regular rhythm without murmur, gallop, or rub.  ABDOMEN:  Soft, benign.  EXTREMITIES:  Warm without calf tenderness, cyanosis, clubbing, or edema.   Heme saturation 94% on room air.   Chest x-ray was reviewed from January 31, 2006 and is normal, except for mild  COPD.   IMPRESSION:  This patient's symptoms are markedly disproportionate to  objective findings.  He, therefore, will return for PFTs and a chest x-ray  in 4-6 weeks, but in the meantime I am going to attempt to try to keep  things just as simple as possible to reduce the likelihood of an adverse  drug effect or misunderstanding regarding his medications.  This is critical  in this patient who has chronic aortic dissection and I emphasized this  point to him today repeatedly, although he himself minimizes this  issue as a  problem stating I know my medicines real well, but then, of course, not  being able to name any of the adjustments that were made.   He did agree to return in 4-6 weeks for follow-up.  In the meantime I  recommended TSH, BNP, CBC to complete the workup for unexplained dyspnea and  bicarb level in the form of a BMET to workup unexplained dyspnea.                                   [name], MD, [facility]   MBW/MedQ  DD:  03/14/2006  DT:  03/15/2006  Job #:  [id number]

## 2010-12-17 ENCOUNTER — Ambulatory Visit (INDEPENDENT_AMBULATORY_CARE_PROVIDER_SITE_OTHER): Payer: Medicare Other | Admitting: Internal Medicine

## 2010-12-17 DIAGNOSIS — E538 Deficiency of other specified B group vitamins: Secondary | ICD-10-CM

## 2010-12-17 MED ORDER — CYANOCOBALAMIN 1000 MCG/ML IJ SOLN
1000.0000 ug | Freq: Once | INTRAMUSCULAR | Status: AC
  Administered 2010-12-17: 1000 ug via INTRAMUSCULAR

## 2010-12-18 NOTE — Progress Notes (Signed)
  Subjective:    Patient ID: [name], male    DOB: Nov 09, 1934, 76 y.o.   MRN: [id number]  HPI  Here for B12 shot  Review of Systems     Objective:   Physical Exam        Assessment & Plan:

## 2011-01-02 ENCOUNTER — Other Ambulatory Visit: Payer: Self-pay | Admitting: Internal Medicine

## 2011-01-21 ENCOUNTER — Ambulatory Visit (INDEPENDENT_AMBULATORY_CARE_PROVIDER_SITE_OTHER): Payer: Medicare Other | Admitting: Family Medicine

## 2011-01-21 DIAGNOSIS — E538 Deficiency of other specified B group vitamins: Secondary | ICD-10-CM

## 2011-01-21 MED ORDER — CYANOCOBALAMIN 1000 MCG/ML IJ SOLN
1000.0000 ug | Freq: Once | INTRAMUSCULAR | Status: AC
  Administered 2011-01-21: 1000 ug via INTRAMUSCULAR

## 2011-01-21 NOTE — Progress Notes (Signed)
Here for b12 injection

## 2011-02-25 ENCOUNTER — Ambulatory Visit: Payer: Medicare Other | Admitting: Internal Medicine

## 2011-02-25 ENCOUNTER — Ambulatory Visit (INDEPENDENT_AMBULATORY_CARE_PROVIDER_SITE_OTHER): Payer: Medicare Other | Admitting: Internal Medicine

## 2011-02-25 DIAGNOSIS — E538 Deficiency of other specified B group vitamins: Secondary | ICD-10-CM

## 2011-02-25 MED ORDER — CYANOCOBALAMIN 1000 MCG/ML IJ SOLN
1000.0000 ug | Freq: Once | INTRAMUSCULAR | Status: AC
  Administered 2011-02-25: 1000 ug via INTRAMUSCULAR

## 2011-02-25 NOTE — Progress Notes (Signed)
B12 injection given today during nurse visit. 

## 2011-03-26 ENCOUNTER — Ambulatory Visit: Payer: Medicare Other | Admitting: Internal Medicine

## 2011-03-29 ENCOUNTER — Encounter: Payer: Self-pay | Admitting: Internal Medicine

## 2011-03-30 ENCOUNTER — Encounter: Payer: Self-pay | Admitting: Internal Medicine

## 2011-03-30 ENCOUNTER — Ambulatory Visit (INDEPENDENT_AMBULATORY_CARE_PROVIDER_SITE_OTHER): Payer: Medicare Other | Admitting: Internal Medicine

## 2011-03-30 ENCOUNTER — Ambulatory Visit: Payer: Self-pay | Admitting: Internal Medicine

## 2011-03-30 VITALS — BP 117/64 | HR 69 | Temp 98.3°F | Ht 68.0 in | Wt 182.0 lb

## 2011-03-30 DIAGNOSIS — Z23 Encounter for immunization: Secondary | ICD-10-CM

## 2011-03-30 DIAGNOSIS — J449 Chronic obstructive pulmonary disease, unspecified: Secondary | ICD-10-CM

## 2011-03-30 DIAGNOSIS — E538 Deficiency of other specified B group vitamins: Secondary | ICD-10-CM

## 2011-03-30 DIAGNOSIS — R569 Unspecified convulsions: Secondary | ICD-10-CM

## 2011-03-30 DIAGNOSIS — J4489 Other specified chronic obstructive pulmonary disease: Secondary | ICD-10-CM

## 2011-03-30 DIAGNOSIS — E119 Type 2 diabetes mellitus without complications: Secondary | ICD-10-CM

## 2011-03-30 DIAGNOSIS — I10 Essential (primary) hypertension: Secondary | ICD-10-CM

## 2011-03-30 LAB — LIPID PANEL
HDL: 42.2 mg/dL (ref >39.00)
Total CHOL/HDL Ratio: 4
Triglycerides: 232 mg/dL — ABNORMAL HIGH (ref 0.0–149.0)

## 2011-03-30 LAB — CBC WITH DIFFERENTIAL/PLATELET
Basophils Relative: 0.5 % (ref 0.0–3.0)
Eosinophils Absolute: 0.2 10*3/uL (ref 0.0–0.7)
Eosinophils Relative: 3.1 % (ref 0.0–5.0)
Lymphocytes Relative: 10.8 % — ABNORMAL LOW (ref 12.0–46.0)
Monocytes Relative: 6.7 % (ref 3.0–12.0)
Neutrophils Relative %: 78.9 % — ABNORMAL HIGH (ref 43.0–77.0)
RBC: 4.55 Mil/uL (ref 4.22–5.81)
WBC: 7.7 10*3/uL (ref 4.5–10.5)

## 2011-03-30 LAB — HEPATIC FUNCTION PANEL
ALT: 16 U/L (ref 0–53)
AST: 19 U/L (ref 0–37)
Bilirubin, Direct: 0 mg/dL (ref 0.0–0.3)
Total Bilirubin: 0.5 mg/dL (ref 0.3–1.2)

## 2011-03-30 LAB — BASIC METABOLIC PANEL
Calcium: 9 mg/dL (ref 8.4–10.5)
Creatinine, Ser: 1.2 mg/dL (ref 0.4–1.5)
GFR: 62.35 mL/min (ref >60.00)

## 2011-03-30 LAB — LDL CHOLESTEROL, DIRECT: Direct LDL: 103.8 mg/dL

## 2011-03-30 MED ORDER — CYANOCOBALAMIN 1000 MCG/ML IJ SOLN
1000.0000 ug | Freq: Once | INTRAMUSCULAR | Status: AC
  Administered 2011-03-30: 1000 ug via INTRAMUSCULAR

## 2011-03-30 NOTE — Progress Notes (Signed)
Subjective:    Patient ID: [name], male    DOB: Jun 13, 1935, 76 y.o.   MRN: [id number]  HPI Doing okay occ left wrist swelling---goes back to injury in 2000  No new concerns Checks sugars occ Often right around 100 Tries to keep to diabetic diet No regular exercise Eye exam within 1 year  No chest pain No SOB No ankle swelling No palpitations  No seizures Continues on the medication  No cough  No wheezing  Current Outpatient Prescriptions on File Prior to Visit  Medication Sig Dispense Refill  . amLODipine (NORVASC) 10 MG tablet TAKE 1 TABLET BY MOUTH EVERY MORNING  30 tablet  6  . aspirin 81 MG tablet Take 81 mg by mouth daily.        . divalproex (DEPAKOTE) 250 MG EC tablet Take 250 mg by mouth 2 (two) times daily.        Marland Kitchen gabapentin (NEURONTIN) 100 MG capsule TAKE 1 CAPSULE BY MOUTH 3 TIMES A DAY FOR MOVEMENT DISORDER  90 capsule  2  . glucose blood (BAYER CONTOUR TEST) test strip Use as instructed to test once weekly       . labetalol (NORMODYNE) 200 MG tablet Take 1/2 tablet by mouth three times a day       . lansoprazole (PREVACID) 30 MG capsule Take 30 mg by mouth daily.        . multivitamin (THERAGRAN) per tablet Take 1 tablet by mouth daily.        . valsartan (DIOVAN) 160 MG tablet Take 160 mg by mouth daily.         No current facility-administered medications on file prior to visit.    No Known Allergies  Past Medical History  Diagnosis Date  . COPD (chronic obstructive pulmonary disease)   . Diabetes mellitus   . Hypertension   . Aortic dissection     type B  . Deep vein thrombosis     history of  . Renal insufficiency   . Movement disorder   . Seizure disorder     Past Surgical History  Procedure Date  . Appendectomy   . Hernia repair   . Pelvic and para-aortic lymph node dissection     type B    Family History  Problem Relation Age of Onset  . Alzheimer's disease Mother   . Hypertension Mother   . Heart disease Father   . COPD  Brother     History   Social History  . Marital Status: Single    Spouse Name: N/A    Number of Children: N/A  . Years of Education: N/A   Occupational History  . Retired    Social History Main Topics  . Smoking status: Former Smoker -- 1.0 packs/day for 30 years    Types: Cigarettes    Quit date: 06/28/1980  . Smokeless tobacco: Never Used  . Alcohol Use: No  . Drug Use: No  . Sexually Active: Not on file   Other Topics Concern  . Not on file   Social History Narrative  . No narrative on file   Review of Systems Appetite is fine Weight down slightly Sleeps fine    Objective:   Physical Exam  Constitutional: He appears well-developed and well-nourished. No distress.  Neck: Normal range of motion. Neck supple. No thyromegaly present.  Cardiovascular: Normal rate and regular rhythm.  Exam reveals no gallop.   No murmur heard.      Very distant heart  sounds Faint pedal pulses  Pulmonary/Chest: Effort normal and breath sounds normal. No respiratory distress. He has no wheezes. He has no rales.  Abdominal: Soft. There is no tenderness.  Musculoskeletal: Normal range of motion. He exhibits no edema and no tenderness.       Bilateral bunions--no pain  Lymphadenopathy:    He has no cervical adenopathy.  Skin: No rash noted.       Mycotic toenails without inflammation  Psychiatric: He has a normal mood and affect. His behavior is normal.          Assessment & Plan:

## 2011-03-30 NOTE — Assessment & Plan Note (Signed)
Lab Results  Component Value Date   HGBA1C 5.8 09/25/2010   Still seems to have excellent control without meds Will check lipid also

## 2011-03-30 NOTE — Assessment & Plan Note (Signed)
No problems since stopping cigarettes

## 2011-03-30 NOTE — Assessment & Plan Note (Signed)
BP Readings from Last 3 Encounters:  03/30/11 117/64  09/25/10 122/66  03/16/10 102/60   Good control Will continue ARB for renoprotection--will cut amlodipine

## 2011-03-30 NOTE — Assessment & Plan Note (Signed)
No seizures on depakote Check levels and labs

## 2011-03-30 NOTE — Patient Instructions (Signed)
Please cut the amlodipine in half and only take 5mg  daily

## 2011-04-01 LAB — HEMOGLOBIN A1C: Hgb A1c MFr Bld: 5.8 % (ref 4.6–6.5)

## 2011-04-07 ENCOUNTER — Telehealth: Payer: Self-pay | Admitting: *Deleted

## 2011-04-07 NOTE — Telephone Encounter (Signed)
Patient called back and I advised lab results.

## 2011-04-07 NOTE — Telephone Encounter (Signed)
.  left message to have patient return my call.  

## 2011-04-07 NOTE — Telephone Encounter (Signed)
Message copied by Sueanne Margarita on Wed Apr 07, 2011  3:26 PM ------      Message from: Tillman Abide I      Created: Sat Apr 03, 2011  9:44 AM       Please call      Blood work looks good      Diabetes control is excellent with HgbA1c of 5.8%      Chol is fine with total of 174 and LDL or bad chol of 103      Thyroid is borderline---we will just recheck this next time      Blood count, liver, kidney and depakote levels are all fine      No changes needed

## 2011-04-13 ENCOUNTER — Other Ambulatory Visit: Payer: Self-pay | Admitting: Internal Medicine

## 2011-04-30 ENCOUNTER — Ambulatory Visit (INDEPENDENT_AMBULATORY_CARE_PROVIDER_SITE_OTHER): Payer: Medicare Other | Admitting: *Deleted

## 2011-04-30 DIAGNOSIS — E538 Deficiency of other specified B group vitamins: Secondary | ICD-10-CM

## 2011-04-30 MED ORDER — CYANOCOBALAMIN 1000 MCG/ML IJ SOLN
1000.0000 ug | Freq: Once | INTRAMUSCULAR | Status: AC
  Administered 2011-04-30: 1000 ug via INTRAMUSCULAR

## 2011-04-30 NOTE — Progress Notes (Signed)
B12 injection given during nurse visit today. 

## 2011-05-31 ENCOUNTER — Ambulatory Visit (INDEPENDENT_AMBULATORY_CARE_PROVIDER_SITE_OTHER): Payer: Medicare Other | Admitting: *Deleted

## 2011-05-31 DIAGNOSIS — E538 Deficiency of other specified B group vitamins: Secondary | ICD-10-CM

## 2011-05-31 MED ORDER — CYANOCOBALAMIN 1000 MCG/ML IJ SOLN
1000.0000 ug | Freq: Once | INTRAMUSCULAR | Status: AC
  Administered 2011-05-31: 1000 ug via INTRAMUSCULAR

## 2011-06-07 ENCOUNTER — Other Ambulatory Visit: Payer: Self-pay | Admitting: Internal Medicine

## 2011-07-01 ENCOUNTER — Ambulatory Visit (INDEPENDENT_AMBULATORY_CARE_PROVIDER_SITE_OTHER): Payer: Medicare Other | Admitting: *Deleted

## 2011-07-01 DIAGNOSIS — E538 Deficiency of other specified B group vitamins: Secondary | ICD-10-CM

## 2011-07-01 MED ORDER — CYANOCOBALAMIN 1000 MCG/ML IJ SOLN
1000.0000 ug | Freq: Once | INTRAMUSCULAR | Status: AC
  Administered 2011-07-01: 1000 ug via INTRAMUSCULAR

## 2011-07-07 ENCOUNTER — Other Ambulatory Visit: Payer: Self-pay | Admitting: Oncology

## 2011-07-23 ENCOUNTER — Other Ambulatory Visit: Payer: Self-pay | Admitting: Internal Medicine

## 2011-08-05 ENCOUNTER — Ambulatory Visit (INDEPENDENT_AMBULATORY_CARE_PROVIDER_SITE_OTHER): Payer: Medicare Other | Admitting: *Deleted

## 2011-08-05 DIAGNOSIS — E538 Deficiency of other specified B group vitamins: Secondary | ICD-10-CM

## 2011-08-05 MED ORDER — CYANOCOBALAMIN 1000 MCG/ML IJ SOLN
1000.0000 ug | Freq: Once | INTRAMUSCULAR | Status: AC
  Administered 2011-08-05: 1000 ug via INTRAMUSCULAR

## 2011-09-03 ENCOUNTER — Ambulatory Visit (INDEPENDENT_AMBULATORY_CARE_PROVIDER_SITE_OTHER): Payer: Medicare Other

## 2011-09-03 ENCOUNTER — Telehealth: Payer: Self-pay

## 2011-09-03 DIAGNOSIS — E538 Deficiency of other specified B group vitamins: Secondary | ICD-10-CM

## 2011-09-03 MED ORDER — CYANOCOBALAMIN 1000 MCG/ML IJ SOLN
1000.0000 ug | Freq: Once | INTRAMUSCULAR | Status: AC
  Administered 2011-09-03: 1000 ug via INTRAMUSCULAR

## 2011-09-03 NOTE — Telephone Encounter (Signed)
Dee I gave pt his Vit B12 injection but I do not see where listed on med list. I looked back even into centricity and did not see order ( but I could have overlooked).Please advise.

## 2011-09-03 NOTE — Telephone Encounter (Signed)
The order expired, added to med list

## 2011-09-08 ENCOUNTER — Other Ambulatory Visit: Payer: Self-pay | Admitting: Internal Medicine

## 2011-09-13 ENCOUNTER — Other Ambulatory Visit: Payer: Self-pay | Admitting: *Deleted

## 2011-09-13 NOTE — Telephone Encounter (Signed)
Please confirm he is still taking this If so, okay to fill x 1 year  Make sure he has a follow up appt

## 2011-09-13 NOTE — Telephone Encounter (Signed)
.  left message to have patient return my call.  

## 2011-09-13 NOTE — Telephone Encounter (Signed)
Last refilled by Dr. Arthur Holms, please advise

## 2011-09-14 MED ORDER — GABAPENTIN 100 MG PO CAPS: 100.0000 mg | ORAL_CAPSULE | Freq: Three times a day (TID) | ORAL | Status: DC

## 2011-09-14 NOTE — Telephone Encounter (Signed)
rx sent to pharmacy by e-script  

## 2011-09-14 NOTE — Telephone Encounter (Signed)
Pt came by the office. He states that he is currently taking the Gabapentin and has set up f/u appt.

## 2011-10-01 ENCOUNTER — Ambulatory Visit: Payer: Medicare Other

## 2011-10-01 ENCOUNTER — Encounter: Payer: Self-pay | Admitting: Internal Medicine

## 2011-10-01 ENCOUNTER — Ambulatory Visit (INDEPENDENT_AMBULATORY_CARE_PROVIDER_SITE_OTHER): Payer: Medicare Other | Admitting: Internal Medicine

## 2011-10-01 VITALS — BP 108/60 | HR 67 | Temp 98.3°F | Ht 68.0 in | Wt 179.0 lb

## 2011-10-01 DIAGNOSIS — J449 Chronic obstructive pulmonary disease, unspecified: Secondary | ICD-10-CM

## 2011-10-01 DIAGNOSIS — I10 Essential (primary) hypertension: Secondary | ICD-10-CM

## 2011-10-01 DIAGNOSIS — E119 Type 2 diabetes mellitus without complications: Secondary | ICD-10-CM

## 2011-10-01 DIAGNOSIS — R569 Unspecified convulsions: Secondary | ICD-10-CM

## 2011-10-01 DIAGNOSIS — E538 Deficiency of other specified B group vitamins: Secondary | ICD-10-CM

## 2011-10-01 MED ORDER — CYANOCOBALAMIN 1000 MCG/ML IJ SOLN
1000.0000 ug | Freq: Once | INTRAMUSCULAR | Status: AC
  Administered 2011-10-01: 1000 ug via INTRAMUSCULAR

## 2011-10-01 NOTE — Assessment & Plan Note (Signed)
BP Readings from Last 3 Encounters:  10/01/11 108/60  03/30/11 117/64  09/25/10 122/66   Good control still Continue ARB given DM diagnosis

## 2011-10-01 NOTE — Assessment & Plan Note (Signed)
Doing fine since not smoking for over 30 years

## 2011-10-01 NOTE — Progress Notes (Signed)
Subjective:    Patient ID: [name], male    DOB: [birth year], 77 y.o.   MRN: [id number]  HPI Doing well No new concerns  Checks blood sugars once a week Usually under 100 No Rx still Still tries to eat carefully  No cough  No SOB  No chest pain No headaches No edema No dizziness or syncope  No seizures No apparent med problems  Current Outpatient Prescriptions on File Prior to Visit  Medication Sig Dispense Refill  . amLODipine (NORVASC) 10 MG tablet TAKE 1 TABLET BY MOUTH EVERY MORNING  30 tablet  6  . aspirin 81 MG tablet Take 81 mg by mouth daily.        . cyanocobalamin (,VITAMIN B-12,) 1000 MCG/ML injection Inject 1,000 mcg into the muscle every 30 (thirty) days.      Marland Kitchen DIOVAN 160 MG tablet TAKE 1 TABLET BY MOUTH IN THE MORNING  30 tablet  10  . divalproex (DEPAKOTE) 250 MG DR tablet 1 TAB BY MOUTH TWO TIMES A DAY FOR SEIZURE  60 tablet  3  . gabapentin (NEURONTIN) 100 MG capsule Take 1 capsule (100 mg total) by mouth 3 (three) times daily.  90 capsule  11  . glucose blood (BAYER CONTOUR TEST) test strip Use as instructed to test once weekly       . labetalol (NORMODYNE) 200 MG tablet TAKE 1/2 TABLET BY MOUTH 3 TIMES A DAY  180 tablet  1  . lansoprazole (PREVACID) 30 MG capsule Take 30 mg by mouth daily.        . multivitamin (THERAGRAN) per tablet Take 1 tablet by mouth daily.        Marland Kitchen DISCONTD: amLODipine (NORVASC) 10 MG tablet         No current facility-administered medications on file prior to visit.    No Known Allergies  Past Medical History  Diagnosis Date  . COPD (chronic obstructive pulmonary disease)   . Diabetes mellitus   . Hypertension   . Aortic dissection     type B  . Deep vein thrombosis     history of  . Renal insufficiency   . Movement disorder   . Seizure disorder     Past Surgical History  Procedure Date  . Appendectomy   . Hernia repair   . Pelvic and para-aortic lymph node dissection     type B    Family History    Problem Relation Age of Onset  . Alzheimer's disease Mother   . Hypertension Mother   . Heart disease Father   . COPD Brother     History   Social History  . Marital Status: Single    Spouse Name: N/A    Number of Children: N/A  . Years of Education: N/A   Occupational History  . Retired    Social History Main Topics  . Smoking status: Former Smoker -- 1.0 packs/day for 30 years    Types: Cigarettes    Quit date: 06/28/1980  . Smokeless tobacco: Never Used  . Alcohol Use: No  . Drug Use: No  . Sexually Active: Not on file   Other Topics Concern  . Not on file   Social History Narrative  . No narrative on file   Review of Systems Weight is up a few pounds    Objective:   Physical Exam  Constitutional: He appears well-developed and well-nourished. No distress.  Neck: Normal range of motion. Neck supple. No thyromegaly present.  Cardiovascular:  Normal rate, regular rhythm and normal heart sounds.  Exam reveals no gallop.   No murmur heard.      Faint pedal pulses  Pulmonary/Chest: Effort normal and breath sounds normal. No respiratory distress. He has no wheezes. He has no rales.  Musculoskeletal: He exhibits no edema and no tenderness.       Bunions bilaterally   Lymphadenopathy:    He has no cervical adenopathy.  Neurological:       Normal sensation in feet  Skin: No rash noted.       No ulcers  Psychiatric: He has a normal mood and affect. His behavior is normal.          Assessment & Plan:

## 2011-10-01 NOTE — Assessment & Plan Note (Signed)
Lab Results  Component Value Date   HGBA1C 5.8 03/30/2011   Still seems to have excellent control without meds LDL 103--would not treat this

## 2011-10-01 NOTE — Assessment & Plan Note (Signed)
None on the medication Level on low side so will just check it again next time

## 2011-10-04 ENCOUNTER — Encounter: Payer: Self-pay | Admitting: *Deleted

## 2011-10-23 ENCOUNTER — Other Ambulatory Visit: Payer: Self-pay | Admitting: Internal Medicine

## 2011-11-02 ENCOUNTER — Ambulatory Visit (INDEPENDENT_AMBULATORY_CARE_PROVIDER_SITE_OTHER): Payer: Medicare Other | Admitting: *Deleted

## 2011-11-02 DIAGNOSIS — E538 Deficiency of other specified B group vitamins: Secondary | ICD-10-CM

## 2011-11-02 MED ORDER — CYANOCOBALAMIN 1000 MCG/ML IJ SOLN
1000.0000 ug | Freq: Once | INTRAMUSCULAR | Status: AC
  Administered 2011-11-02: 1000 ug via INTRAMUSCULAR

## 2011-12-02 ENCOUNTER — Other Ambulatory Visit: Payer: Self-pay | Admitting: Internal Medicine

## 2011-12-03 ENCOUNTER — Telehealth: Payer: Self-pay | Admitting: Internal Medicine

## 2011-12-03 ENCOUNTER — Ambulatory Visit (INDEPENDENT_AMBULATORY_CARE_PROVIDER_SITE_OTHER): Payer: Medicare Other | Admitting: *Deleted

## 2011-12-03 DIAGNOSIS — E538 Deficiency of other specified B group vitamins: Secondary | ICD-10-CM

## 2011-12-03 MED ORDER — CYANOCOBALAMIN 1000 MCG/ML IJ SOLN
1000.0000 ug | Freq: Once | INTRAMUSCULAR | Status: AC
  Administered 2011-12-03: 1000 ug via INTRAMUSCULAR

## 2011-12-03 NOTE — Telephone Encounter (Signed)
Left message on VM with results, advised pt to call if any questions  

## 2011-12-03 NOTE — Telephone Encounter (Signed)
Probably best he not take that high a dose He can cut those in half if they aren't coated If can't cut--have him take every other day till they are used up---then go back to the 81mg 

## 2011-12-03 NOTE — Telephone Encounter (Signed)
Pt is wondering about his bayer asprin. He was taking 59 mg's that he bought at the store and he went to get some more and realized that he grabbed the 325 mg's instead. He was wanting to know if this is ok or should he be taking less than that.

## 2012-01-06 ENCOUNTER — Ambulatory Visit (INDEPENDENT_AMBULATORY_CARE_PROVIDER_SITE_OTHER): Payer: Medicare Other | Admitting: *Deleted

## 2012-01-06 DIAGNOSIS — E538 Deficiency of other specified B group vitamins: Secondary | ICD-10-CM

## 2012-01-06 MED ORDER — CYANOCOBALAMIN 1000 MCG/ML IJ SOLN
1000.0000 ug | Freq: Once | INTRAMUSCULAR | Status: AC
  Administered 2012-01-06: 1000 ug via INTRAMUSCULAR

## 2012-01-10 ENCOUNTER — Other Ambulatory Visit: Payer: Self-pay | Admitting: *Deleted

## 2012-01-10 MED ORDER — GLUCOSE BLOOD VI STRP: ORAL_STRIP | Status: DC

## 2012-02-10 ENCOUNTER — Ambulatory Visit (INDEPENDENT_AMBULATORY_CARE_PROVIDER_SITE_OTHER): Payer: Medicare Other | Admitting: *Deleted

## 2012-02-10 DIAGNOSIS — E538 Deficiency of other specified B group vitamins: Secondary | ICD-10-CM

## 2012-02-10 MED ORDER — CYANOCOBALAMIN 1000 MCG/ML IJ SOLN
1000.0000 ug | Freq: Once | INTRAMUSCULAR | Status: AC
  Administered 2012-02-10: 1000 ug via INTRAMUSCULAR

## 2012-03-16 ENCOUNTER — Ambulatory Visit (INDEPENDENT_AMBULATORY_CARE_PROVIDER_SITE_OTHER): Payer: Medicare Other | Admitting: *Deleted

## 2012-03-16 DIAGNOSIS — Z23 Encounter for immunization: Secondary | ICD-10-CM

## 2012-03-16 DIAGNOSIS — E538 Deficiency of other specified B group vitamins: Secondary | ICD-10-CM

## 2012-03-16 MED ORDER — CYANOCOBALAMIN 1000 MCG/ML IJ SOLN
1000.0000 ug | Freq: Once | INTRAMUSCULAR | Status: AC
  Administered 2012-03-16: 1000 ug via INTRAMUSCULAR

## 2012-04-03 ENCOUNTER — Ambulatory Visit (INDEPENDENT_AMBULATORY_CARE_PROVIDER_SITE_OTHER): Payer: Medicare Other | Admitting: Internal Medicine

## 2012-04-03 ENCOUNTER — Encounter: Payer: Self-pay | Admitting: Internal Medicine

## 2012-04-03 VITALS — BP 112/68 | HR 60 | Temp 97.8°F | Ht 68.0 in | Wt 181.0 lb

## 2012-04-03 DIAGNOSIS — I10 Essential (primary) hypertension: Secondary | ICD-10-CM

## 2012-04-03 DIAGNOSIS — R569 Unspecified convulsions: Secondary | ICD-10-CM

## 2012-04-03 DIAGNOSIS — J449 Chronic obstructive pulmonary disease, unspecified: Secondary | ICD-10-CM

## 2012-04-03 DIAGNOSIS — E119 Type 2 diabetes mellitus without complications: Secondary | ICD-10-CM

## 2012-04-03 DIAGNOSIS — J4489 Other specified chronic obstructive pulmonary disease: Secondary | ICD-10-CM

## 2012-04-03 DIAGNOSIS — E538 Deficiency of other specified B group vitamins: Secondary | ICD-10-CM

## 2012-04-03 LAB — CBC WITH DIFFERENTIAL/PLATELET
Eosinophils Relative: 3.1 % (ref 0.0–5.0)
MCV: 89.2 fl (ref 78.0–100.0)
Monocytes Absolute: 0.5 10*3/uL (ref 0.1–1.0)
Monocytes Relative: 7.2 % (ref 3.0–12.0)
Neutrophils Relative %: 77.6 % — ABNORMAL HIGH (ref 43.0–77.0)
Platelets: 298 10*3/uL (ref 150.0–400.0)
WBC: 7 10*3/uL (ref 4.5–10.5)

## 2012-04-03 LAB — TSH: TSH: 3.34 u[IU]/mL (ref 0.35–5.50)

## 2012-04-03 LAB — HEPATIC FUNCTION PANEL
AST: 19 U/L (ref 0–37)
Total Bilirubin: 0.4 mg/dL (ref 0.3–1.2)

## 2012-04-03 LAB — LIPID PANEL
HDL: 38.3 mg/dL — ABNORMAL LOW (ref >39.00)
Total CHOL/HDL Ratio: 5
Triglycerides: 179 mg/dL — ABNORMAL HIGH (ref 0.0–149.0)
VLDL: 35.8 mg/dL (ref 0.0–40.0)

## 2012-04-03 LAB — T4, FREE: Free T4: 0.75 ng/dL (ref 0.60–1.60)

## 2012-04-03 LAB — BASIC METABOLIC PANEL
BUN: 24 mg/dL — ABNORMAL HIGH (ref 6–23)
Creatinine, Ser: 1 mg/dL (ref 0.4–1.5)
GFR: 75.72 mL/min (ref >60.00)

## 2012-04-03 LAB — HEMOGLOBIN A1C: Hgb A1c MFr Bld: 5.8 % (ref 4.6–6.5)

## 2012-04-03 NOTE — Assessment & Plan Note (Signed)
No seizures on Rx  Will check level of valproic acid

## 2012-04-03 NOTE — Assessment & Plan Note (Signed)
BP Readings from Last 3 Encounters:  04/03/12 112/68  10/01/11 108/60  03/30/11 117/64   Good control Will continue for renal protection

## 2012-04-03 NOTE — Progress Notes (Signed)
Subjective:    Patient ID: [name], male    DOB: 04-29-1935, 77 y.o.   MRN: [id number]  HPI Regular follow up Has wrist splint on right---relates to injury in 1999 Splint helps  Doing okay  Checks sugars occasionally Under 110 in general No Rx still No sores or pain in feet  Still on seizure meds No seizures  No chest pain No SOB ni edema No dizziness or fainting spells  Current Outpatient Prescriptions on File Prior to Visit  Medication Sig Dispense Refill  . amLODipine (NORVASC) 10 MG tablet TAKE 1 TABLET BY MOUTH EVERY MORNING  30 tablet  6  . aspirin 81 MG tablet Take 81 mg by mouth daily.        . cyanocobalamin (,VITAMIN B-12,) 1000 MCG/ML injection Inject 1,000 mcg into the muscle every 30 (thirty) days.      Marland Kitchen DIOVAN 160 MG tablet TAKE 1 TABLET BY MOUTH IN THE MORNING  30 tablet  10  . divalproex (DEPAKOTE) 250 MG DR tablet 1 TAB BY MOUTH TWO TIMES A DAY FOR SEIZURE  60 tablet  3  . gabapentin (NEURONTIN) 100 MG capsule Take 1 capsule (100 mg total) by mouth 3 (three) times daily.  90 capsule  11  . glucose blood (BAYER CONTOUR TEST) test strip Use as instructed to test once weekly, dx: 250.00  100 each  11  . labetalol (NORMODYNE) 200 MG tablet TAKE 1/2 TABLET BY MOUTH 3 TIMES A DAY  180 tablet  1  . lansoprazole (PREVACID) 30 MG capsule Take 30 mg by mouth daily.        . multivitamin (THERAGRAN) per tablet Take 1 tablet by mouth daily.          No Known Allergies  Past Medical History  Diagnosis Date  . COPD (chronic obstructive pulmonary disease)   . Diabetes mellitus   . Hypertension   . Aortic dissection     type B  . Deep vein thrombosis     history of  . Renal insufficiency   . Movement disorder   . Seizure disorder     Past Surgical History  Procedure Date  . Appendectomy   . Hernia repair   . Pelvic and para-aortic lymph node dissection     type B    Family History  Problem Relation Age of Onset  . Alzheimer's disease Mother   .  Hypertension Mother   . Heart disease Father   . COPD Brother     History   Social History  . Marital Status: Single    Spouse Name: N/A    Number of Children: N/A  . Years of Education: N/A   Occupational History  . Retired    Social History Main Topics  . Smoking status: Former Smoker -- 1.0 packs/day for 30 years    Types: Cigarettes    Quit date: 06/28/1980  . Smokeless tobacco: Never Used  . Alcohol Use: No  . Drug Use: No  . Sexually Active: Not on file   Other Topics Concern  . Not on file   Social History Narrative  . No narrative on file   Review of Systems Sleeps well Good appetite  Weight stable No depressioin    Objective:   Physical Exam  Constitutional: He appears well-developed and well-nourished. No distress.  Neck: Normal range of motion. Neck supple. No thyromegaly present.  Cardiovascular: Normal rate, regular rhythm and normal heart sounds.  Exam reveals no gallop.  No murmur heard.      Faint pulse on left foot, absent on right  Pulmonary/Chest: Effort normal and breath sounds normal. No respiratory distress. He has no wheezes. He has no rales.  Musculoskeletal: He exhibits no edema and no tenderness.       Mild bunions bilaterally  Lymphadenopathy:    He has no cervical adenopathy.  Skin: No rash noted. No erythema.       Mycotic toenails No foot ulcers  Psychiatric: He has a normal mood and affect. His behavior is normal. Thought content normal.          Assessment & Plan:

## 2012-04-03 NOTE — Assessment & Plan Note (Signed)
Still seems to be fine without Rx Will check labs

## 2012-04-03 NOTE — Assessment & Plan Note (Signed)
No evident problems now Still not smoking

## 2012-04-05 ENCOUNTER — Encounter: Payer: Self-pay | Admitting: *Deleted

## 2012-04-20 ENCOUNTER — Ambulatory Visit (INDEPENDENT_AMBULATORY_CARE_PROVIDER_SITE_OTHER): Payer: Medicare Other

## 2012-04-20 DIAGNOSIS — E538 Deficiency of other specified B group vitamins: Secondary | ICD-10-CM

## 2012-04-20 MED ORDER — CYANOCOBALAMIN 1000 MCG/ML IJ SOLN
1000.0000 ug | Freq: Once | INTRAMUSCULAR | Status: AC
  Administered 2012-04-20: 1000 ug via INTRAMUSCULAR

## 2012-05-22 ENCOUNTER — Other Ambulatory Visit: Payer: Self-pay | Admitting: Internal Medicine

## 2012-05-26 ENCOUNTER — Ambulatory Visit (INDEPENDENT_AMBULATORY_CARE_PROVIDER_SITE_OTHER): Payer: Medicare Other | Admitting: *Deleted

## 2012-05-26 DIAGNOSIS — E538 Deficiency of other specified B group vitamins: Secondary | ICD-10-CM

## 2012-05-26 MED ORDER — CYANOCOBALAMIN 1000 MCG/ML IJ SOLN
1000.0000 ug | Freq: Once | INTRAMUSCULAR | Status: AC
  Administered 2012-05-26: 1000 ug via INTRAMUSCULAR

## 2012-06-19 ENCOUNTER — Other Ambulatory Visit: Payer: Self-pay | Admitting: Internal Medicine

## 2012-06-27 ENCOUNTER — Ambulatory Visit (INDEPENDENT_AMBULATORY_CARE_PROVIDER_SITE_OTHER): Payer: Medicare Other | Admitting: *Deleted

## 2012-06-27 DIAGNOSIS — E538 Deficiency of other specified B group vitamins: Secondary | ICD-10-CM

## 2012-06-27 MED ORDER — CYANOCOBALAMIN 1000 MCG/ML IJ SOLN
1000.0000 ug | Freq: Once | INTRAMUSCULAR | Status: AC
  Administered 2012-06-27: 1000 ug via INTRAMUSCULAR

## 2012-07-28 ENCOUNTER — Ambulatory Visit (INDEPENDENT_AMBULATORY_CARE_PROVIDER_SITE_OTHER): Payer: Medicare Other | Admitting: *Deleted

## 2012-07-28 DIAGNOSIS — E538 Deficiency of other specified B group vitamins: Secondary | ICD-10-CM

## 2012-07-28 MED ORDER — CYANOCOBALAMIN 1000 MCG/ML IJ SOLN
1000.0000 ug | Freq: Once | INTRAMUSCULAR | Status: AC
  Administered 2012-07-28: 1000 ug via INTRAMUSCULAR

## 2012-08-25 ENCOUNTER — Ambulatory Visit (INDEPENDENT_AMBULATORY_CARE_PROVIDER_SITE_OTHER): Payer: Medicare Other | Admitting: *Deleted

## 2012-08-25 DIAGNOSIS — E538 Deficiency of other specified B group vitamins: Secondary | ICD-10-CM

## 2012-08-25 MED ORDER — CYANOCOBALAMIN 1000 MCG/ML IJ SOLN
1000.0000 ug | Freq: Once | INTRAMUSCULAR | Status: AC
  Administered 2012-08-25: 1000 ug via INTRAMUSCULAR

## 2012-09-07 ENCOUNTER — Other Ambulatory Visit: Payer: Self-pay | Admitting: Internal Medicine

## 2012-09-22 ENCOUNTER — Ambulatory Visit (INDEPENDENT_AMBULATORY_CARE_PROVIDER_SITE_OTHER): Payer: Medicare Other | Admitting: *Deleted

## 2012-09-22 DIAGNOSIS — E538 Deficiency of other specified B group vitamins: Secondary | ICD-10-CM

## 2012-09-22 MED ORDER — CYANOCOBALAMIN 1000 MCG/ML IJ SOLN
1000.0000 ug | Freq: Once | INTRAMUSCULAR | Status: AC
  Administered 2012-09-22: 1000 ug via INTRAMUSCULAR

## 2012-09-26 ENCOUNTER — Other Ambulatory Visit: Payer: Self-pay | Admitting: Internal Medicine

## 2012-10-07 ENCOUNTER — Other Ambulatory Visit: Payer: Self-pay | Admitting: Internal Medicine

## 2012-10-16 ENCOUNTER — Encounter: Payer: Self-pay | Admitting: *Deleted

## 2012-10-17 ENCOUNTER — Encounter: Payer: Self-pay | Admitting: Internal Medicine

## 2012-10-17 ENCOUNTER — Ambulatory Visit (INDEPENDENT_AMBULATORY_CARE_PROVIDER_SITE_OTHER): Payer: Medicare Other | Admitting: Internal Medicine

## 2012-10-17 VITALS — BP 128/70 | HR 60 | Temp 98.3°F | Ht 68.0 in | Wt 183.0 lb

## 2012-10-17 DIAGNOSIS — I10 Essential (primary) hypertension: Secondary | ICD-10-CM

## 2012-10-17 DIAGNOSIS — E119 Type 2 diabetes mellitus without complications: Secondary | ICD-10-CM

## 2012-10-17 DIAGNOSIS — J4489 Other specified chronic obstructive pulmonary disease: Secondary | ICD-10-CM

## 2012-10-17 DIAGNOSIS — G40909 Epilepsy, unspecified, not intractable, without status epilepticus: Secondary | ICD-10-CM

## 2012-10-17 DIAGNOSIS — Z0000 Encounter for general adult medical examination without abnormal findings: Secondary | ICD-10-CM

## 2012-10-17 DIAGNOSIS — J449 Chronic obstructive pulmonary disease, unspecified: Secondary | ICD-10-CM

## 2012-10-17 LAB — HEMOGLOBIN A1C: Hgb A1c MFr Bld: 6 % (ref 4.6–6.5)

## 2012-10-17 LAB — HM DIABETES FOOT EXAM

## 2012-10-17 NOTE — Progress Notes (Signed)
Subjective:    Patient ID: [name], male    DOB: 05-Nov-1934, 78 y.o.   MRN: [id number]  HPI Here for Medicare wellness and follow up Did fall once and had to be hospitalized and go to rehab for weakness No depression or anhedonia Drives still. Independent with ADLs and instrumental ADLs. Eats out other than breakfast. No tobacco or alcohol No other doctors other than eye doctor Vision and hearing are okay No exercise No memory problems. Pays own bills. Doesn't get lost in car No sexual partner now  Checks sugars once a week Run in 90's usually  UTD with eye exam  No chest pain No SOB--- exercise tolerance is stable (though limited) No edema No palpitations  No seizures Still on med  No regular cough Breathing is okay  Current Outpatient Prescriptions on File Prior to Visit  Medication Sig Dispense Refill  . amLODipine (NORVASC) 10 MG tablet TAKE 1 TABLET BY MOUTH EVERY MORNING  30 tablet  6  . aspirin 81 MG tablet Take 81 mg by mouth daily.        . cyanocobalamin (,VITAMIN B-12,) 1000 MCG/ML injection Inject 1,000 mcg into the muscle every 30 (thirty) days.      Marland Kitchen DIOVAN 160 MG tablet TAKE 1 TABLET BY MOUTH IN THE MORNING  30 tablet  10  . divalproex (DEPAKOTE) 250 MG DR tablet 1 TAB BY MOUTH TWO TIMES A DAY FOR SEIZURE  60 tablet  3  . gabapentin (NEURONTIN) 100 MG capsule TAKE 1 CAPSULE (100 MG TOTAL) BY MOUTH 3 (THREE) TIMES DAILY.  90 capsule  9  . glucose blood (BAYER CONTOUR TEST) test strip Use as instructed to test once weekly, dx: 250.00  100 each  11  . labetalol (NORMODYNE) 200 MG tablet TAKE 1/2 TABLET BY MOUTH 3 TIMES A DAY  180 tablet  1  . lansoprazole (PREVACID) 30 MG capsule Take 30 mg by mouth daily.        . multivitamin (THERAGRAN) per tablet Take 1 tablet by mouth daily.         No current facility-administered medications on file prior to visit.    No Known Allergies  Past Medical History  Diagnosis Date  . COPD (chronic obstructive  pulmonary disease)   . Diabetes mellitus   . Hypertension   . Aortic dissection     type B  . Deep vein thrombosis     history of  . Renal insufficiency   . Movement disorder   . Seizure disorder     Past Surgical History  Procedure Laterality Date  . Appendectomy    . Hernia repair    . Pelvic and para-aortic lymph node dissection      type B    Family History  Problem Relation Age of Onset  . Alzheimer's disease Mother   . Hypertension Mother   . Heart disease Father   . COPD Brother     History   Social History  . Marital Status: Single    Spouse Name: N/A    Number of Children: N/A  . Years of Education: N/A   Occupational History  . Retired    Social History Main Topics  . Smoking status: Former Smoker -- 1.00 packs/day for 30 years    Types: Cigarettes    Quit date: 06/28/1980  . Smokeless tobacco: Never Used  . Alcohol Use: No  . Drug Use: No  . Sexually Active: Not on file   Other Topics  Concern  . Not on file   Social History Narrative   No living will   Requests nephew Haddon Fyfe or SIL Bodin Gorka to be health care POA.   Requests DNR after discussion. Written 10/17/12   Would not want tube feedings if cognitively unaware   Review of Systems Sleeps well Appetite is good  Weight is up 2# Bowels have been fine No urinary problems. Nocturia is only occasional    Objective:   Physical Exam  Constitutional: He is oriented to person, place, and time. He appears well-developed and well-nourished. No distress.  HENT:  Mouth/Throat: Oropharynx is clear and moist. No oropharyngeal exudate.  Neck: Normal range of motion. Neck supple. No thyromegaly present.  Cardiovascular: Normal rate, regular rhythm, normal heart sounds and intact distal pulses.  Exam reveals no gallop.   No murmur heard. Very faint heart sounds--- best to right of sternum Normal pulse on right foot, mildly diminished on left  Pulmonary/Chest: Effort normal and breath sounds  normal. No respiratory distress. He has no wheezes. He has no rales.  Abdominal: Soft. There is no tenderness.  Musculoskeletal: He exhibits no edema and no tenderness.  Bunions bilaterally  Lymphadenopathy:    He has no cervical adenopathy.  Neurological: He is alert and oriented to person, place, and time.  President- "Susa Loffler, [place], ?" [id number]  (doesn't do numbers well) Can't spell (went to school only to 16) Recall 0/3  Skin:  Dry scaly areas on feet Mycotic toenails  Psychiatric: He has a normal mood and affect. His behavior is normal.          Assessment & Plan:

## 2012-10-17 NOTE — Assessment & Plan Note (Signed)
Still seems to be well controlled without meds Will check labs

## 2012-10-17 NOTE — Assessment & Plan Note (Signed)
No significant symptoms No Rx needed

## 2012-10-17 NOTE — Assessment & Plan Note (Signed)
I have personally reviewed the Medicare Annual Wellness questionnaire and have noted 1. The patient's medical and social history 2. Their use of alcohol, tobacco or illicit drugs 3. Their current medications and supplements 4. The patient's functional ability including ADL's, fall risks, home safety risks and hearing or visual             impairment. 5. Diet and physical activities 6. Evidence for depression or mood disorders  The patients weight, height, BMI and visual acuity have been recorded in the chart I have made referrals, counseling and provided education to the patient based review of the above and I have provided the pt with a written personalized care plan for preventive services.  I have provided you with a copy of your personalized plan for preventive services. Please take the time to review along with your updated medication list.  Cognitive issues that I think are his baseline. Will monitor UTD with colon No PSA due to age Has had flu and pneumovax and doesn't want zostavax

## 2012-10-17 NOTE — Assessment & Plan Note (Signed)
None on the med Will continue

## 2012-10-17 NOTE — Assessment & Plan Note (Signed)
BP Readings from Last 3 Encounters:  10/17/12 128/70  04/03/12 112/68  10/01/11 108/60   Good control No changes needed Lab Results  Component Value Date   CREATININE 1.0 04/03/2012

## 2012-10-18 ENCOUNTER — Encounter: Payer: Self-pay | Admitting: *Deleted

## 2012-10-26 ENCOUNTER — Ambulatory Visit: Payer: Medicare Other

## 2012-12-12 ENCOUNTER — Ambulatory Visit (INDEPENDENT_AMBULATORY_CARE_PROVIDER_SITE_OTHER): Payer: Medicare Other | Admitting: Family Medicine

## 2012-12-12 DIAGNOSIS — E538 Deficiency of other specified B group vitamins: Secondary | ICD-10-CM

## 2012-12-12 MED ORDER — CYANOCOBALAMIN 1000 MCG/ML IJ SOLN
1000.0000 ug | Freq: Once | INTRAMUSCULAR | Status: AC
  Administered 2012-12-12: 1000 ug via INTRAMUSCULAR

## 2013-01-12 ENCOUNTER — Ambulatory Visit (INDEPENDENT_AMBULATORY_CARE_PROVIDER_SITE_OTHER): Payer: Medicare Other | Admitting: *Deleted

## 2013-01-12 DIAGNOSIS — E538 Deficiency of other specified B group vitamins: Secondary | ICD-10-CM

## 2013-01-12 MED ORDER — CYANOCOBALAMIN 1000 MCG/ML IJ SOLN
1000.0000 ug | Freq: Once | INTRAMUSCULAR | Status: AC
  Administered 2013-01-12: 1000 ug via INTRAMUSCULAR

## 2013-02-07 ENCOUNTER — Other Ambulatory Visit: Payer: Self-pay | Admitting: Internal Medicine

## 2013-02-16 ENCOUNTER — Ambulatory Visit (INDEPENDENT_AMBULATORY_CARE_PROVIDER_SITE_OTHER): Payer: Medicare Other | Admitting: *Deleted

## 2013-02-16 DIAGNOSIS — E538 Deficiency of other specified B group vitamins: Secondary | ICD-10-CM

## 2013-02-16 MED ORDER — CYANOCOBALAMIN 1000 MCG/ML IJ SOLN
1000.0000 ug | Freq: Once | INTRAMUSCULAR | Status: AC
  Administered 2013-02-16: 1000 ug via INTRAMUSCULAR

## 2013-03-08 ENCOUNTER — Encounter: Payer: Self-pay | Admitting: Internal Medicine

## 2013-03-15 ENCOUNTER — Other Ambulatory Visit: Payer: Self-pay | Admitting: Internal Medicine

## 2013-03-15 NOTE — Telephone Encounter (Signed)
Okay to refill for a year 

## 2013-03-15 NOTE — Telephone Encounter (Signed)
Done

## 2013-03-22 ENCOUNTER — Other Ambulatory Visit: Payer: Self-pay | Admitting: Internal Medicine

## 2013-03-22 ENCOUNTER — Ambulatory Visit (INDEPENDENT_AMBULATORY_CARE_PROVIDER_SITE_OTHER): Payer: Medicare Other | Admitting: *Deleted

## 2013-03-22 DIAGNOSIS — E538 Deficiency of other specified B group vitamins: Secondary | ICD-10-CM

## 2013-03-22 MED ORDER — CYANOCOBALAMIN 1000 MCG/ML IJ SOLN
1000.0000 ug | Freq: Once | INTRAMUSCULAR | Status: AC
  Administered 2013-03-22: 1000 ug via INTRAMUSCULAR

## 2013-04-18 ENCOUNTER — Ambulatory Visit: Payer: Medicare Other | Admitting: Internal Medicine

## 2013-04-24 ENCOUNTER — Encounter: Payer: Self-pay | Admitting: Internal Medicine

## 2013-04-24 ENCOUNTER — Ambulatory Visit (INDEPENDENT_AMBULATORY_CARE_PROVIDER_SITE_OTHER): Payer: Medicare Other | Admitting: Internal Medicine

## 2013-04-24 VITALS — BP 110/70 | HR 60 | Temp 97.5°F | Wt 171.0 lb

## 2013-04-24 DIAGNOSIS — E538 Deficiency of other specified B group vitamins: Secondary | ICD-10-CM

## 2013-04-24 DIAGNOSIS — I10 Essential (primary) hypertension: Secondary | ICD-10-CM

## 2013-04-24 DIAGNOSIS — E119 Type 2 diabetes mellitus without complications: Secondary | ICD-10-CM

## 2013-04-24 DIAGNOSIS — G40909 Epilepsy, unspecified, not intractable, without status epilepticus: Secondary | ICD-10-CM

## 2013-04-24 DIAGNOSIS — Z23 Encounter for immunization: Secondary | ICD-10-CM

## 2013-04-24 DIAGNOSIS — J449 Chronic obstructive pulmonary disease, unspecified: Secondary | ICD-10-CM

## 2013-04-24 LAB — HEPATIC FUNCTION PANEL
ALT: 12 U/L (ref 0–53)
AST: 16 U/L (ref 0–37)
Alkaline Phosphatase: 49 U/L (ref 39–117)
Bilirubin, Direct: 0 mg/dL (ref 0.0–0.3)
Total Protein: 7 g/dL (ref 6.0–8.3)

## 2013-04-24 LAB — CBC WITH DIFFERENTIAL/PLATELET
Basophils Relative: 0.5 % (ref 0.0–3.0)
Eosinophils Relative: 2.9 % (ref 0.0–5.0)
HCT: 38.9 % — ABNORMAL LOW (ref 39.0–52.0)
Lymphs Abs: 0.7 10*3/uL (ref 0.7–4.0)
Monocytes Relative: 7.7 % (ref 3.0–12.0)
Neutrophils Relative %: 75.9 % (ref 43.0–77.0)
Platelets: 253 10*3/uL (ref 150.0–400.0)
RBC: 4.43 Mil/uL (ref 4.22–5.81)
WBC: 5.4 10*3/uL (ref 4.5–10.5)

## 2013-04-24 LAB — TSH: TSH: 2.91 u[IU]/mL (ref 0.35–5.50)

## 2013-04-24 LAB — BASIC METABOLIC PANEL
BUN: 13 mg/dL (ref 6–23)
Chloride: 106 mEq/L (ref 96–112)
GFR: 78.15 mL/min (ref >60.00)
Potassium: 4.5 mEq/L (ref 3.5–5.1)

## 2013-04-24 LAB — LIPID PANEL
Cholesterol: 168 mg/dL (ref 0–200)
LDL Cholesterol: 95 mg/dL (ref 0–99)
Total CHOL/HDL Ratio: 5
VLDL: 35.6 mg/dL (ref 0.0–40.0)

## 2013-04-24 MED ORDER — CYANOCOBALAMIN 1000 MCG/ML IJ SOLN
1000.0000 ug | Freq: Once | INTRAMUSCULAR | Status: AC
  Administered 2013-04-24: 1000 ug via INTRAMUSCULAR

## 2013-04-24 NOTE — Progress Notes (Signed)
Subjective:    Patient ID: [name], male    DOB: 1934-07-13, 78 y.o.   MRN: [id number]  HPI Doing okay Sprained right wrist---wearing splint which helps  Checks sugars weekly Usually ~90 No hypoglycemic reactions No foot pain or numbness Discussed statin-- he would be willing to take one  No chest pain No SOB No dizziness or syncope No sig    No seizures  No cough or wheezing  Current Outpatient Prescriptions on File Prior to Visit  Medication Sig Dispense Refill  . amLODipine (NORVASC) 10 MG tablet TAKE 1 TABLET BY MOUTH EVERY MORNING  30 tablet  6  . aspirin 81 MG tablet Take 81 mg by mouth daily.        . cyanocobalamin (,VITAMIN B-12,) 1000 MCG/ML injection Inject 1,000 mcg into the muscle every 30 (thirty) days.      Marland Kitchen DIOVAN 160 MG tablet TAKE 1 TABLET BY MOUTH IN THE MORNING  30 tablet  10  . divalproex (DEPAKOTE) 250 MG DR tablet TAKE 1 TAB BY MOUTH TWO TIMES A DAY FOR SEIZURE  60 tablet  3  . gabapentin (NEURONTIN) 100 MG capsule TAKE 1 CAPSULE (100 MG TOTAL) BY MOUTH 3 (THREE) TIMES DAILY.  90 capsule  9  . glucose blood (BAYER CONTOUR TEST) test strip Use as instructed to test once weekly, dx: 250.00  100 each  11  . labetalol (NORMODYNE) 200 MG tablet TAKE 1/2 TABLET BY MOUTH 3 TIMES A DAY  180 tablet  3  . lansoprazole (PREVACID) 30 MG capsule Take 30 mg by mouth daily.        . multivitamin (THERAGRAN) per tablet Take 1 tablet by mouth daily.         No current facility-administered medications on file prior to visit.    No Known Allergies  Past Medical History  Diagnosis Date  . COPD (chronic obstructive pulmonary disease)   . Diabetes mellitus   . Hypertension   . Aortic dissection     type B  . Deep vein thrombosis     history of  . Renal insufficiency   . Movement disorder   . Seizure disorder     Past Surgical History  Procedure Laterality Date  . Appendectomy    . Hernia repair    . Pelvic and para-aortic lymph node dissection     type B    Family History  Problem Relation Age of Onset  . Alzheimer's disease Mother   . Hypertension Mother   . Heart disease Father   . COPD Brother     History   Social History  . Marital Status: Single    Spouse Name: N/A    Number of Children: N/A  . Years of Education: N/A   Occupational History  . Retired    Social History Main Topics  . Smoking status: Former Smoker -- 1.00 packs/day for 30 years    Types: Cigarettes    Quit date: 06/28/1980  . Smokeless tobacco: Never Used  . Alcohol Use: No  . Drug Use: No  . Sexual Activity: Not on file   Other Topics Concern  . Not on file   Social History Narrative   No living will   Requests nephew Chas Axel or SIL Adetokunbo Mccadden to be health care POA.   Requests DNR after discussion. Written 10/17/12   Would not want tube feedings if cognitively unaware   Review of Systems Appetite is fine Weight is down 12#  Sleeps  great     Objective:   Physical Exam  Constitutional: He appears well-developed and well-nourished. No distress.  Neck: Normal range of motion. Neck supple. No thyromegaly present.  Cardiovascular: Normal rate, regular rhythm and normal heart sounds.  Exam reveals no gallop.   No murmur heard. Faint pedal pulses  Pulmonary/Chest: Effort normal and breath sounds normal. No respiratory distress. He has no wheezes. He has no rales.  Musculoskeletal: He exhibits no edema and no tenderness.  Bunions Mycotic toenails  Lymphadenopathy:    He has no cervical adenopathy.  Skin: No rash noted. No erythema.  Psychiatric: He has a normal mood and affect. His behavior is normal.          Assessment & Plan:

## 2013-04-24 NOTE — Assessment & Plan Note (Signed)
None in some time Due for VPA level

## 2013-04-24 NOTE — Assessment & Plan Note (Signed)
Stopped smoking long ago No sig symptoms

## 2013-04-24 NOTE — Addendum Note (Signed)
Addended by: Sueanne Margarita on: 04/24/2013 12:59 PM   Modules accepted: Orders

## 2013-04-24 NOTE — Assessment & Plan Note (Signed)
Good control Discussed statin---if LDL still >100, will start atorvastatin 10

## 2013-04-24 NOTE — Assessment & Plan Note (Signed)
BP Readings from Last 3 Encounters:  04/24/13 110/70  10/17/12 128/70  04/03/12 112/68   Good control Due for labs

## 2013-04-25 ENCOUNTER — Encounter: Payer: Self-pay | Admitting: *Deleted

## 2013-05-29 ENCOUNTER — Ambulatory Visit (INDEPENDENT_AMBULATORY_CARE_PROVIDER_SITE_OTHER): Payer: Medicare Other

## 2013-05-29 DIAGNOSIS — E538 Deficiency of other specified B group vitamins: Secondary | ICD-10-CM

## 2013-05-29 MED ORDER — CYANOCOBALAMIN 1000 MCG/ML IJ SOLN
1000.0000 ug | Freq: Once | INTRAMUSCULAR | Status: AC
  Administered 2013-05-29: 1000 ug via INTRAMUSCULAR

## 2013-07-03 ENCOUNTER — Ambulatory Visit (INDEPENDENT_AMBULATORY_CARE_PROVIDER_SITE_OTHER): Payer: Medicare Other

## 2013-07-03 DIAGNOSIS — E538 Deficiency of other specified B group vitamins: Secondary | ICD-10-CM

## 2013-07-03 MED ORDER — CYANOCOBALAMIN 1000 MCG/ML IJ SOLN
1000.0000 ug | Freq: Once | INTRAMUSCULAR | Status: AC
  Administered 2013-07-03: 1000 ug via INTRAMUSCULAR

## 2013-07-18 ENCOUNTER — Other Ambulatory Visit: Payer: Self-pay | Admitting: Internal Medicine

## 2013-08-07 ENCOUNTER — Ambulatory Visit (INDEPENDENT_AMBULATORY_CARE_PROVIDER_SITE_OTHER): Payer: Medicare Other

## 2013-08-07 DIAGNOSIS — E538 Deficiency of other specified B group vitamins: Secondary | ICD-10-CM

## 2013-08-07 MED ORDER — CYANOCOBALAMIN 1000 MCG/ML IJ SOLN
1000.0000 ug | Freq: Once | INTRAMUSCULAR | Status: AC
  Administered 2013-08-07: 1000 ug via INTRAMUSCULAR

## 2013-09-05 ENCOUNTER — Ambulatory Visit (INDEPENDENT_AMBULATORY_CARE_PROVIDER_SITE_OTHER): Payer: Medicare Other

## 2013-09-05 DIAGNOSIS — E538 Deficiency of other specified B group vitamins: Secondary | ICD-10-CM

## 2013-09-05 MED ORDER — CYANOCOBALAMIN 1000 MCG/ML IJ SOLN
1000.0000 ug | Freq: Once | INTRAMUSCULAR | Status: AC
  Administered 2013-09-05: 1000 ug via INTRAMUSCULAR

## 2013-10-10 ENCOUNTER — Ambulatory Visit: Payer: Medicare Other

## 2013-10-17 ENCOUNTER — Other Ambulatory Visit: Payer: Self-pay | Admitting: *Deleted

## 2013-10-17 MED ORDER — DIVALPROEX SODIUM 250 MG PO DR TAB
250.0000 mg | DELAYED_RELEASE_TABLET | Freq: Two times a day (BID) | ORAL | Status: DC
Start: 2013-10-17 — End: 2013-11-11

## 2013-10-17 MED ORDER — VALSARTAN 160 MG PO TABS: 160.0000 mg | ORAL_TABLET | Freq: Every day | ORAL | Status: DC

## 2013-10-17 MED ORDER — AMLODIPINE BESYLATE 10 MG PO TABS: 10.0000 mg | ORAL_TABLET | Freq: Every day | ORAL | Status: DC

## 2013-10-23 ENCOUNTER — Encounter: Payer: Medicare Other | Admitting: Internal Medicine

## 2013-10-29 ENCOUNTER — Ambulatory Visit (INDEPENDENT_AMBULATORY_CARE_PROVIDER_SITE_OTHER): Payer: Medicare Other | Admitting: Internal Medicine

## 2013-10-29 ENCOUNTER — Encounter: Payer: Self-pay | Admitting: Internal Medicine

## 2013-10-29 VITALS — BP 118/70 | HR 72 | Temp 97.5°F | Ht 68.0 in | Wt 167.0 lb

## 2013-10-29 DIAGNOSIS — I10 Essential (primary) hypertension: Secondary | ICD-10-CM

## 2013-10-29 DIAGNOSIS — E538 Deficiency of other specified B group vitamins: Secondary | ICD-10-CM

## 2013-10-29 DIAGNOSIS — G3184 Mild cognitive impairment, so stated: Secondary | ICD-10-CM

## 2013-10-29 DIAGNOSIS — G40909 Epilepsy, unspecified, not intractable, without status epilepticus: Secondary | ICD-10-CM

## 2013-10-29 DIAGNOSIS — E119 Type 2 diabetes mellitus without complications: Secondary | ICD-10-CM

## 2013-10-29 DIAGNOSIS — Z0000 Encounter for general adult medical examination without abnormal findings: Secondary | ICD-10-CM

## 2013-10-29 DIAGNOSIS — J449 Chronic obstructive pulmonary disease, unspecified: Secondary | ICD-10-CM

## 2013-10-29 DIAGNOSIS — J4489 Other specified chronic obstructive pulmonary disease: Secondary | ICD-10-CM

## 2013-10-29 LAB — HM DIABETES FOOT EXAM

## 2013-10-29 LAB — MICROALBUMIN / CREATININE URINE RATIO
CREATININE, U: 204 mg/dL
Microalb Creat Ratio: 0.8 mg/g (ref 0.0–30.0)
Microalb, Ur: 1.7 mg/dL (ref 0.0–1.9)

## 2013-10-29 LAB — HEMOGLOBIN A1C: Hgb A1c MFr Bld: 5.9 % (ref 4.6–6.5)

## 2013-10-29 MED ORDER — CYANOCOBALAMIN 1000 MCG/ML IJ SOLN
1000.0000 ug | Freq: Once | INTRAMUSCULAR | Status: AC
  Administered 2013-10-29: 1000 ug via INTRAMUSCULAR

## 2013-10-29 NOTE — Assessment & Plan Note (Signed)
No functional problems Could be related to past seizures or his Rx

## 2013-10-29 NOTE — Progress Notes (Signed)
Pre visit review using our clinic review tool, if applicable. No additional management support is needed unless otherwise documented below in the visit note. 

## 2013-10-29 NOTE — Assessment & Plan Note (Signed)
I have personally reviewed the Medicare Annual Wellness questionnaire and have noted 1. The patient's medical and social history 2. Their use of alcohol, tobacco or illicit drugs 3. Their current medications and supplements 4. The patient's functional ability including ADL's, fall risks, home safety risks and hearing or visual             impairment. 5. Diet and physical activities 6. Evidence for depression or mood disorders  The patients weight, height, BMI and visual acuity have been recorded in the chart I have made referrals, counseling and provided education to the patient based review of the above and I have provided the pt with a written personalized care plan for preventive services.  I have provided you with a copy of your personalized plan for preventive services. Please take the time to review along with your updated medication list.  Will update prevnar when our stock is refilled No other action needed

## 2013-10-29 NOTE — Progress Notes (Signed)
Subjective:    Patient ID: [name], male    DOB: 1935-02-11, 79 y.o.   MRN: [id number]  HPI Here for Medicare wellness and follow up No falls No depression or anhedonia Sees only eye doctor and me No set exercise No alcohol. No tobacco products Vision and hearing are fine No cognitive problems Still drives and independent with all instrumental activities of daily living No sexual partner Reviewed his meds No cancer screening due to age UTD with immunizations--- due prevnar (but we are out)  Checks his sugars a few times a week Generally in 90's No sores or pain in feet Keeps up with eye doctor  No chest pain No palpitations No dizziness or syncope No edema  No recent cough No set exercise but does house and yard work--no change in exercise tolerance No pollen problems  No recent seizures  Current Outpatient Prescriptions on File Prior to Visit  Medication Sig Dispense Refill  . amLODipine (NORVASC) 10 MG tablet Take 1 tablet (10 mg total) by mouth daily.  90 tablet  3  . aspirin 81 MG tablet Take 81 mg by mouth daily.        . cyanocobalamin (,VITAMIN B-12,) 1000 MCG/ML injection Inject 1,000 mcg into the muscle every 30 (thirty) days.      . divalproex (DEPAKOTE) 250 MG DR tablet Take 1 tablet (250 mg total) by mouth 2 (two) times daily.  180 tablet  3  . gabapentin (NEURONTIN) 100 MG capsule TAKE 1 CAPSULE (100 MG TOTAL) BY MOUTH 3 (THREE) TIMES DAILY.  90 capsule  9  . glucose blood (BAYER CONTOUR TEST) test strip Use as instructed to test once weekly, dx: 250.00  100 each  11  . labetalol (NORMODYNE) 200 MG tablet TAKE 1/2 TABLET BY MOUTH 3 TIMES A DAY  180 tablet  3  . lansoprazole (PREVACID) 30 MG capsule Take 30 mg by mouth daily.        . multivitamin (THERAGRAN) per tablet Take 1 tablet by mouth daily.        . valsartan (DIOVAN) 160 MG tablet Take 1 tablet (160 mg total) by mouth daily.  90 tablet  3   No current facility-administered medications on  file prior to visit.    No Known Allergies  Past Medical History  Diagnosis Date  . COPD (chronic obstructive pulmonary disease)   . Diabetes mellitus   . Hypertension   . Aortic dissection     type B  . Deep vein thrombosis     history of  . Renal insufficiency   . Movement disorder   . Seizure disorder     Past Surgical History  Procedure Laterality Date  . Appendectomy    . Hernia repair    . Pelvic and para-aortic lymph node dissection      type B    Family History  Problem Relation Age of Onset  . Alzheimer's disease Mother   . Hypertension Mother   . Heart disease Father   . COPD Brother     History   Social History  . Marital Status: Single    Spouse Name: N/A    Number of Children: N/A  . Years of Education: N/A   Occupational History  . Retired    Social History Main Topics  . Smoking status: Former Smoker -- 1.00 packs/day for 30 years    Types: Cigarettes    Quit date: 06/28/1980  . Smokeless tobacco: Never Used  . Alcohol  Use: No  . Drug Use: No  . Sexual Activity: Not on file   Other Topics Concern  . Not on file   Social History Narrative   No living will   Requests nephew Isaac LaudRay Bergin to be health care POA.   Requests DNR after discussion. Written 10/17/12   Would not want tube feedings if cognitively unaware   Review of Systems Sleeps well Appetite is fine Weight is down 4# Bowels are fine No urinary problems. Only occasional nocturia     Objective:   Physical Exam  Constitutional: He is oriented to person, place, and time. He appears well-developed and well-nourished. No distress.  HENT:  Mouth/Throat: Oropharynx is clear and moist. No oropharyngeal exudate.  Neck: Normal range of motion. Neck supple. No thyromegaly present.  Cardiovascular: Normal rate, regular rhythm, normal heart sounds and intact distal pulses.  Exam reveals no gallop.   No murmur heard. Pulmonary/Chest: Effort normal and breath sounds normal. No  respiratory distress. He has no wheezes. He has no rales.  Abdominal: Soft. There is no tenderness.  Musculoskeletal: He exhibits no edema and no tenderness.  Bilateral bunions  Lymphadenopathy:    He has no cervical adenopathy.  Neurological: He is alert and oriented to person, place, and time.  President-- "Susa LofflerBarack Obama, Bush, Bush, ??" [phone] Wouldn't try to spell world backwards Recall 0/3  Normal fine touch sensation on plantar feet  Skin: No rash noted. No erythema.  Mycotic thick toenails No foot lesions otherwise  Psychiatric: He has a normal mood and affect. His behavior is normal.          Assessment & Plan:

## 2013-10-29 NOTE — Assessment & Plan Note (Signed)
Seems to still have good control Due for A1c 

## 2013-10-29 NOTE — Assessment & Plan Note (Signed)
No current symptoms

## 2013-10-29 NOTE — Assessment & Plan Note (Signed)
Controlled on depakote 

## 2013-10-29 NOTE — Assessment & Plan Note (Signed)
BP Readings from Last 3 Encounters:  10/29/13 118/70  04/24/13 110/70  10/17/12 128/70   Good control No changes needed

## 2013-10-30 ENCOUNTER — Encounter: Payer: Self-pay | Admitting: Family Medicine

## 2013-10-30 ENCOUNTER — Telehealth: Payer: Self-pay | Admitting: Internal Medicine

## 2013-10-30 NOTE — Telephone Encounter (Signed)
Relevant patient education mailed to patient.  

## 2013-11-02 ENCOUNTER — Telehealth: Payer: Self-pay | Admitting: Internal Medicine

## 2013-11-02 LAB — CBC AND DIFFERENTIAL
HCT: 40 % — AB (ref 41–53)
Hemoglobin: 13.2 g/dL — AB (ref 13.5–17.5)
PLATELETS: 284 10*3/uL (ref 150–399)
WBC: 7.7 10^3/mL

## 2013-11-02 LAB — HEPATIC FUNCTION PANEL
ALK PHOS: 79 U/L (ref 25–125)
ALT: 21 U/L (ref 10–40)
AST: 24 U/L (ref 14–40)
Bilirubin, Total: 0.6 mg/dL

## 2013-11-02 LAB — BASIC METABOLIC PANEL
BUN: 37 mg/dL — AB (ref 4–21)
Creatinine: 1.4 mg/dL — AB (ref 0.6–1.3)
Glucose: 91 mg/dL
POTASSIUM: 3.7 mmol/L (ref 3.4–5.3)
Sodium: 139 mmol/L (ref 137–147)

## 2013-11-02 NOTE — Telephone Encounter (Signed)
[place]-- Dr Cleotilde NeerMeekley [place] the [place] [address] [place] to [place] oil when out of gas Stated he was going for short trip (locally) but wound up 7 hours away  So far work up benign Discussed his meds and history Nephew will bring him back and MD will fax records  Will set up follow up soon to review his memory issues  Lyla SonCarrie Please call him next week to set up follow up

## 2013-11-03 LAB — BASIC METABOLIC PANEL
BUN: 31 mg/dL — AB (ref 4–21)
Creatinine: 1 mg/dL (ref 0.6–1.3)
GLUCOSE: 101 mg/dL
POTASSIUM: 4.1 mmol/L (ref 3.4–5.3)

## 2013-11-03 LAB — HEPATIC FUNCTION PANEL
ALT: 16 U/L (ref 10–40)
AST: 26 U/L (ref 14–40)
Alkaline Phosphatase: 64 U/L (ref 25–125)
Bilirubin, Total: 0.3 mg/dL

## 2013-11-03 LAB — VALPROIC ACID LEVEL: VALPROIC ACID LVL: 45.8

## 2013-11-06 ENCOUNTER — Non-Acute Institutional Stay (SKILLED_NURSING_FACILITY): Payer: Medicare Other | Admitting: Adult Health

## 2013-11-06 ENCOUNTER — Telehealth: Payer: Self-pay

## 2013-11-06 DIAGNOSIS — K219 Gastro-esophageal reflux disease without esophagitis: Secondary | ICD-10-CM

## 2013-11-06 DIAGNOSIS — J449 Chronic obstructive pulmonary disease, unspecified: Secondary | ICD-10-CM

## 2013-11-06 DIAGNOSIS — F0150 Vascular dementia without behavioral disturbance: Secondary | ICD-10-CM

## 2013-11-06 DIAGNOSIS — G40909 Epilepsy, unspecified, not intractable, without status epilepticus: Secondary | ICD-10-CM

## 2013-11-06 DIAGNOSIS — I10 Essential (primary) hypertension: Secondary | ICD-10-CM

## 2013-11-06 NOTE — Telephone Encounter (Signed)
Relevant patient education mailed to patient.  

## 2013-11-08 ENCOUNTER — Ambulatory Visit: Payer: Medicare Other | Admitting: Internal Medicine

## 2013-11-08 ENCOUNTER — Telehealth: Payer: Self-pay | Admitting: *Deleted

## 2013-11-08 DIAGNOSIS — Z0289 Encounter for other administrative examinations: Secondary | ICD-10-CM

## 2013-11-08 NOTE — Telephone Encounter (Signed)
No answer at home or cell number, phone just rang no VM, will try again later.

## 2013-11-09 NOTE — Telephone Encounter (Signed)
I will let [facility] handle things while there Not sure he will be going back home but I will pick up his care again if he does leave

## 2013-11-09 NOTE — Telephone Encounter (Signed)
Spoke with patient on his cell and [facility]'s in [facility], he didn't know the name of the facility so I spoke with a lady that was in the room( not a relative) and he's in rehab.

## 2013-11-11 ENCOUNTER — Encounter: Payer: Self-pay | Admitting: Adult Health

## 2013-11-11 DIAGNOSIS — I10 Essential (primary) hypertension: Secondary | ICD-10-CM

## 2013-11-11 DIAGNOSIS — K219 Gastro-esophageal reflux disease without esophagitis: Secondary | ICD-10-CM | POA: Insufficient documentation

## 2013-11-11 DIAGNOSIS — F0150 Vascular dementia without behavioral disturbance: Secondary | ICD-10-CM | POA: Insufficient documentation

## 2013-11-11 HISTORY — DX: Essential (primary) hypertension: I10

## 2013-11-11 NOTE — Progress Notes (Signed)
Patient ID: Brown HumanJohn Sosa, male   DOB: 19-Feb-1935, 79 y.o.   MRN: [id number]     [facility]  No Known Allergies    Chief Complaint  Patient presents with  . Hospitalization Follow-up    HPI:  [place]; however; he was found in [place] out of gas in his car confused. He was hospitalized for acute delirium. He has had his medications adjusted. He is here for short term rehab. At this time his goal is to return back home. I am not certain if that is viable or not at this time.    Past Medical History  Diagnosis Date  . COPD (chronic obstructive pulmonary disease)   . Diabetes mellitus   . Hypertension   . Aortic dissection     type B  . Deep vein thrombosis     history of  . Renal insufficiency   . Movement disorder   . Seizure disorder     Past Surgical History  Procedure Laterality Date  . Appendectomy    . Hernia repair    . Pelvic and para-aortic lymph node dissection      type B    VITAL SIGNS BP 126/81  Pulse 73  Ht 5\' 11"  (1.803 m)  Wt 169 lb (76.658 kg)  BMI 23.58 kg/m2   Patient's Medications  New Prescriptions   No medications on file  Previous Medications   ASPIRIN 81 MG TABLET    Take 81 mg by mouth daily.     GABAPENTIN (NEURONTIN) 100 MG CAPSULE    TAKE 1 CAPSULE (100 MG TOTAL) BY MOUTH 3 (THREE) TIMES DAILY.   LANSOPRAZOLE (PREVACID) 30 MG CAPSULE    Take 30 mg by mouth daily.    Modified Medications   Modified Medication Previous Medication   DIVALPROEX (DEPAKOTE) 250 MG DR TABLET divalproex (DEPAKOTE) 250 MG DR tablet      Take 500 mg by mouth 2 (two) times daily.    Take 1 tablet (250 mg total) by mouth 2 (two) times daily.  Discontinued Medications   AMLODIPINE (NORVASC) 10 MG TABLET    Take 1 tablet (10 mg total) by mouth daily.   CYANOCOBALAMIN (,VITAMIN B-12,) 1000 MCG/ML INJECTION    Inject 1,000 mcg into the muscle every 30 (thirty) days.   GLUCOSE BLOOD (BAYER CONTOUR TEST) TEST STRIP    Use as  instructed to test once weekly, dx: 250.00   LABETALOL (NORMODYNE) 200 MG TABLET    TAKE 1/2 TABLET BY MOUTH 3 TIMES A DAY   MULTIVITAMIN (THERAGRAN) PER TABLET    Take 1 tablet by mouth daily.     VALSARTAN (DIOVAN) 160 MG TABLET    Take 1 tablet (160 mg total) by mouth daily.    SIGNIFICANT DIAGNOSTIC EXAMS  11-02-13: ct of head: 1. Significant motion artifact. 2. No evidence of acute or subacute ischemia. No acute intracranial abnormality. 3. Generalized volume loss with evidence of chronic microvascular ischemia.    LABS REVIEWED:   10-29-13: hgb a1c 5.9  11-02-13: wbc 7.7; hgb 13.2; hct 40.1; mcv 87.6; plt 284; glucose 91; bun 37; creat 1.4; k+3.7; na++139 liver normal albumin 3.3  11-03-13: glucose 101; bun 31; creat 1.0; k+4.1; liver normal albumin 2.6; depakote 45.8      Review of Systems  Constitutional: Negative for malaise/fatigue.  Eyes: Negative for blurred vision.  Respiratory: Negative for cough and shortness of breath.   Cardiovascular: Negative for chest pain, palpitations and leg  swelling.  Gastrointestinal: Negative for heartburn, abdominal pain and constipation.  Musculoskeletal: Negative for back pain, joint pain and myalgias.  Skin: Negative.   Neurological: Negative for dizziness.  Psychiatric/Behavioral: Negative for depression. The patient is not nervous/anxious.       Physical Exam  Constitutional: No distress.  thin  Neck: Neck supple. No JVD present. No thyromegaly present.  Cardiovascular: Normal rate, regular rhythm and intact distal pulses.   Respiratory: Effort normal and breath sounds normal. No respiratory distress. He has no wheezes.  GI: Soft. Bowel sounds are normal. He exhibits no distension. There is no tenderness.  Musculoskeletal: Normal range of motion. He exhibits no edema.  Neurological: He is alert.  Skin: Skin is warm and dry. He is not diaphoretic.  Psychiatric: He has a normal mood and affect.      ASSESSMENT/ PLAN:  1.  Hypertension: he is presently stable; is not on medications at this time; will continue asa 81 mg daily; will continue to monitor his status and will treat as indicated   2. Seizures: no recent seizure activity present; will continue depakote 500 mg twice daily and neurontin 100 mg three times daily and will continue to monitor his status.   3. Genella RifeGerd: will continue prevacid 30 mg daily   4. Vascular dementia: he is presently without change is not on medications as this time. Will not make changes at this time and will monitor his status.   5. Copd: is stable is not taking medications at this time; will monitor his status    Time spent with patient 50 minutes   Synthia Innocenteborah Green NP [facility]  Contact [phone] Monday through Friday 8am- 5pm  After hours call [phone]

## 2013-11-12 ENCOUNTER — Non-Acute Institutional Stay (SKILLED_NURSING_FACILITY): Payer: Medicare Other | Admitting: Internal Medicine

## 2013-11-12 ENCOUNTER — Encounter: Payer: Self-pay | Admitting: Internal Medicine

## 2013-11-12 DIAGNOSIS — K219 Gastro-esophageal reflux disease without esophagitis: Secondary | ICD-10-CM

## 2013-11-12 DIAGNOSIS — R5383 Other fatigue: Secondary | ICD-10-CM

## 2013-11-12 DIAGNOSIS — R5381 Other malaise: Secondary | ICD-10-CM

## 2013-11-12 DIAGNOSIS — R531 Weakness: Secondary | ICD-10-CM | POA: Insufficient documentation

## 2013-11-12 DIAGNOSIS — G40909 Epilepsy, unspecified, not intractable, without status epilepticus: Secondary | ICD-10-CM

## 2013-11-12 DIAGNOSIS — E119 Type 2 diabetes mellitus without complications: Secondary | ICD-10-CM

## 2013-11-12 DIAGNOSIS — F0150 Vascular dementia without behavioral disturbance: Secondary | ICD-10-CM

## 2013-11-12 NOTE — Progress Notes (Signed)
Patient ID: Brown HumanJohn Sosa, male   DOB: [birth year], 79 y.o.   MRN: [id number]     [name]  PCP: Brian Abideichard Letvak, MD  Code Status: full code  No Known Allergies  Chief Complaint: new admission  HPI:  79 y/o male patient is here for rehabilitation after hospital admission from 11/02/13- 11/05/13 with delirium and mental status changes. After extensive workup with neurology on board, it was thought to be in setting of his dementia. He had medication adjustment and then was sent to SNF for STR. He is seen in his room with his sitter in the room. He is alert and oriented to person and place only. He is in no distress. He had a fall last night while trying to get out of his toilet seat. No injury reported. He denies any concern today. He would like to go home. He has been working with therapy team  Review of Systems:  Constitutional: Negative for fever, chills, diaphoresis.  HENT: Negative for congestion, hearing loss and sore throat.   Eyes: Negative for eye pain, blurred vision, double vision and discharge.  Respiratory: Negative for cough, sputum production, shortness of breath and wheezing.   Cardiovascular: Negative for chest pain, palpitations, orthopnea and leg swelling.  Gastrointestinal: Negative for heartburn, nausea, vomiting, abdominal pain, diarrhea and constipation.  Genitourinary: Negative for dysuria, urgency, frequency, hematuria and flank pain.  Musculoskeletal: Negative for back pain,myalgias.  Skin: Negative for itching and rash.  Neurological: Negative for dizziness, tingling, focal weakness and headaches.  Psychiatric/Behavioral: Negative for depression   Past Medical History  Diagnosis Date  . COPD (chronic obstructive pulmonary disease)   . Diabetes mellitus   . Hypertension   . Aortic dissection     type B  . Deep vein thrombosis     history of  . Renal insufficiency   . Movement disorder   . Seizure disorder   . Essential hypertension, benign  11/11/2013   Past Surgical History  Procedure Laterality Date  . Appendectomy    . Hernia repair    . Pelvic and para-aortic lymph node dissection      type B   Social History:   reports that he quit smoking about 33 years ago. His smoking use included Cigarettes. He has a 30 pack-year smoking history. He has never used smokeless tobacco. He reports that he does not drink alcohol or use illicit drugs.  Family History  Problem Relation Age of Onset  . Alzheimer's disease Mother   . Hypertension Mother   . Heart disease Father   . COPD Brother     Medications: Patient's Medications  New Prescriptions   No medications on file  Previous Medications   ASPIRIN 81 MG TABLET    Take 81 mg by mouth daily.     DIVALPROEX (DEPAKOTE) 250 MG DR TABLET    Take 500 mg by mouth 2 (two) times daily.   GABAPENTIN (NEURONTIN) 100 MG CAPSULE    TAKE 1 CAPSULE (100 MG TOTAL) BY MOUTH 3 (THREE) TIMES DAILY.   PANTOPRAZOLE (PROTONIX) 20 MG TABLET    Take 20 mg by mouth daily.  Modified Medications   No medications on file  Discontinued Medications   LANSOPRAZOLE (PREVACID) 30 MG CAPSULE    Take 30 mg by mouth daily.       Physical Exam: Filed Vitals:   11/12/13 1130  BP: 117/78  Pulse: 65  Temp: 97.9 F (36.6 C)  Resp: 18  SpO2: 93%  General- elderly male in no acute distress Head- atraumatic, normocephalic Eyes- PERRLA, EOMI, no pallor, no icterus, no discharge Neck- no lymphadenopathy, no thyromegaly Throat- moist mucus membrane, normal oropharynx Nose- normal nasal mucosa, no maxillary or frontal sinus tenderness Cardiovascular- normal s1,s2, no murmurs/ rubs/ gallops Respiratory- bilateral clear to auscultation, no wheeze, no rhonchi, no crackles, no use of accessory muscles Abdomen- bowel sounds present, soft, non tender Musculoskeletal- able to move all 4 extremities, on wheelchair, generalized weakness, no leg edema Neurological- no focal deficit Skin- warm and  dry Psychiatry- alert and oriented to person, place only   Labs reviewed: Basic Metabolic Panel:  Recent Labs  16/03/9609/28/14 0951 11/02/13 11/03/13  NA 140 139  --   K 4.5 3.7 4.1  CL 106  --   --   CO2 26  --   --   GLUCOSE 96  --   --   BUN 13 37* 31*  CREATININE 1.0 1.4* 1.0  CALCIUM 9.2  --   --    Liver Function Tests:  Recent Labs  04/24/13 0951 11/02/13 11/03/13  AST 16 24 26   ALT 12 21 16   ALKPHOS 49 79 64  BILITOT 0.4  --   --   PROT 7.0  --   --   ALBUMIN 3.5  --   --   CBC:  Recent Labs  04/24/13 0951 11/02/13  WBC 5.4 7.7  NEUTROABS 4.1  --   HGB 13.2 13.2*  HCT 38.9* 40*  MCV 88.0  --   PLT 253.0 284   Lab Results  Component Value Date   TSH 2.91 04/24/2013   Lab Results  Component Value Date   HGBA1C 5.9 10/29/2013     Radiological Exams: 11-02-13: ct of head: 1. Significant motion artifact. 2. No evidence of acute or subacute ischemia. No acute intracranial abnormality. 3. Generalized volume loss with evidence of chronic microvascular ischemia.   Assessment/Plan  Generalized weakness Will have him work with physical therapy and occupational therapy team to help with gait training and muscle strengthening exercises.fall precautions. Skin care. Encourage to be out of bed.   Vascular dementia bp well controlled. No behavior changes at present. Continue baby aspirin. Monitor bp readings. Skin prophylaxis and fall precautions. Monitor oral intake  Seizures no recent seizure activity reported. continue depakote 500 mg twice daily and neurontin 100 mg three times daily. seiure precautions  Genella RifeGerd His lansoprazole has been changed to pantoprazole 20 mg daily here. Continue this ntinue prevacid 30 mg daily   Dm type 2 Diet controlled. Monitor clinically  Family/ staff Communication: reviewed care plan with patient and nursing supervisor   Goals of care: short term rehabilitation for now. Given his progressive dementia and hx of falls at home, he  might become a long term care resident   Labs/tests ordered- none    Brian GroutMAHIMA Brian Probert, MD  [facility] [phone] (Monday-Friday 8 am - 5 pm) [phone] (afterhours)

## 2013-11-28 ENCOUNTER — Non-Acute Institutional Stay (SKILLED_NURSING_FACILITY): Payer: Medicare Other | Admitting: Adult Health

## 2013-11-28 DIAGNOSIS — J449 Chronic obstructive pulmonary disease, unspecified: Secondary | ICD-10-CM

## 2013-11-28 DIAGNOSIS — I10 Essential (primary) hypertension: Secondary | ICD-10-CM

## 2013-11-28 DIAGNOSIS — F0150 Vascular dementia without behavioral disturbance: Secondary | ICD-10-CM

## 2013-11-29 ENCOUNTER — Ambulatory Visit: Payer: Medicare Other

## 2013-12-05 ENCOUNTER — Telehealth: Payer: Self-pay

## 2013-12-05 NOTE — Telephone Encounter (Signed)
Raiford Noble PT with [facility] left v/m requesting orders for home health PT for patient. Unable to reach [place] by phone to see how many times a week he wanted to see pt.

## 2013-12-05 NOTE — Telephone Encounter (Signed)
Rick notified as instructed via v/m.

## 2013-12-05 NOTE — Telephone Encounter (Signed)
It is okay for him to start with PT for him

## 2013-12-12 ENCOUNTER — Telehealth: Payer: Self-pay

## 2013-12-12 NOTE — Telephone Encounter (Signed)
Whitt with [facility] left v/m requesting order for roller walker and pt is in nursing home and cannot come to office. Whitt will contact Dr Glade LloydPandey for order and if problem Whitt will cb .

## 2013-12-17 ENCOUNTER — Telehealth: Payer: Self-pay | Admitting: *Deleted

## 2013-12-17 DIAGNOSIS — F0390 Unspecified dementia without behavioral disturbance: Secondary | ICD-10-CM

## 2013-12-17 DIAGNOSIS — M6281 Muscle weakness (generalized): Secondary | ICD-10-CM

## 2013-12-17 DIAGNOSIS — Z5189 Encounter for other specified aftercare: Secondary | ICD-10-CM

## 2013-12-17 DIAGNOSIS — E119 Type 2 diabetes mellitus without complications: Secondary | ICD-10-CM

## 2013-12-17 NOTE — Telephone Encounter (Signed)
Received orders for signature from Alan Ripperanielle Ginges for pt to be admitted and per Dr. Alphonsus SiasLetvak pt would have to be seen since leaving rehab. Per Reuel Boomaniel @ [facility] she will let the family know.

## 2013-12-19 ENCOUNTER — Encounter: Payer: Self-pay | Admitting: Adult Health

## 2013-12-19 NOTE — Progress Notes (Signed)
Patient ID: [name], male   DOB: 10-17-1934, 79 y.o.   MRN: [id number]     [facility]  No Known Allergies   Chief Complaint  Patient presents with  . Discharge Note    HPI:  He is being discharged to assisted living. He does not required skilled nursing at this time; but is not able to live independently. He will need home health for pt/ot. He will need a wheelchair in order to maintain his current level of independence with his adl's which cannot be achieved with a walker. He will not need prescriptions written.    Past Medical History  Diagnosis Date  . COPD (chronic obstructive pulmonary disease)   . Diabetes mellitus   . Hypertension   . Aortic dissection     type B  . Deep vein thrombosis     history of  . Renal insufficiency   . Movement disorder   . Seizure disorder   . Essential hypertension, benign 11/11/2013    Past Surgical History  Procedure Laterality Date  . Appendectomy    . Hernia repair    . Pelvic and para-aortic lymph node dissection      type B    VITAL SIGNS BP 104/72  Pulse 74  Ht 5\' 11"  (1.803 m)  Wt 166 lb 3.2 oz (75.388 kg)  BMI 23.19 kg/m2   Patient's Medications  New Prescriptions   No medications on file  Previous Medications   ASPIRIN 81 MG TABLET    Take 81 mg by mouth daily.     DIVALPROEX (DEPAKOTE) 250 MG DR TABLET    Take 500 mg by mouth 2 (two) times daily.   GABAPENTIN (NEURONTIN) 100 MG CAPSULE    TAKE 1 CAPSULE (100 MG TOTAL) BY MOUTH 3 (THREE) TIMES DAILY.   PANTOPRAZOLE (PROTONIX) 20 MG TABLET    Take 20 mg by mouth daily.  Modified Medications   No medications on file  Discontinued Medications   No medications on file    SIGNIFICANT DIAGNOSTIC EXAMS  11-02-13: ct of head: 1. Significant motion artifact. 2. No evidence of acute or subacute ischemia. No acute intracranial abnormality. 3. Generalized volume loss with evidence of chronic microvascular ischemia.    LABS REVIEWED:   10-29-13: hgb a1c 5.9    11-02-13: wbc 7.7; hgb 13.2; hct 40.1; mcv 87.6; plt 284; glucose 91; bun 37; creat 1.4; k+3.7; na++139 liver normal albumin 3.3  11-03-13: glucose 101; bun 31; creat 1.0; k+4.1; liver normal albumin 2.6; depakote 45.8      Review of Systems  Constitutional: Negative for malaise/fatigue.  Eyes: Negative for blurred vision.  Respiratory: Negative for cough and shortness of breath.   Cardiovascular: Negative for chest pain, palpitations and leg swelling.  Gastrointestinal: Negative for heartburn, abdominal pain and constipation.  Musculoskeletal: Negative for back pain, joint pain and myalgias.  Skin: Negative.   Neurological: Negative for dizziness.  Psychiatric/Behavioral: Negative for depression. The patient is not nervous/anxious.       Physical Exam  Constitutional: No distress.  thin  Neck: Neck supple. No JVD present. No thyromegaly present.  Cardiovascular: Normal rate, regular rhythm and intact distal pulses.   Respiratory: Effort normal and breath sounds normal. No respiratory distress. He has no wheezes.  GI: Soft. Bowel sounds are normal. He exhibits no distension. There is no tenderness.  Musculoskeletal: Normal range of motion. He exhibits no edema.  Neurological: He is alert.  Skin: Skin is warm and dry. He is not diaphoretic.  Psychiatric: He has a normal mood and affect.      ASSESSMENT/ PLAN:  Will discharge him to assisted living with home health for pt/ot. He will need a wheelchair in order to maintain current level of independence. No prescriptions to be written.   Time spent with patient 40 minutes.     Synthia Innocenteborah Green NP [facility]  Contact [phone] Monday through Friday 8am- 5pm  After hours call [phone]

## 2013-12-23 ENCOUNTER — Inpatient Hospital Stay: Payer: Self-pay | Admitting: Internal Medicine

## 2013-12-23 LAB — CK TOTAL AND CKMB (NOT AT ARMC)
CK, Total: 140 U/L
CK-MB: 1.4 ng/mL (ref 0.5–3.6)

## 2013-12-23 LAB — URINALYSIS, COMPLETE
BACTERIA: NONE SEEN
BILIRUBIN, UR: NEGATIVE
BLOOD: NEGATIVE
Glucose,UR: NEGATIVE mg/dL (ref 0–75)
Leukocyte Esterase: NEGATIVE
NITRITE: NEGATIVE
Ph: 6 (ref 4.5–8.0)
Protein: NEGATIVE
RBC,UR: 2 /HPF (ref 0–5)
SPECIFIC GRAVITY: 1.019 (ref 1.003–1.030)
SQUAMOUS EPITHELIAL: NONE SEEN
WBC UR: <1 /HPF (ref 0–5)

## 2013-12-23 LAB — COMPREHENSIVE METABOLIC PANEL
ALK PHOS: 74 U/L
ALT: 18 U/L (ref 12–78)
Albumin: 2.8 g/dL — ABNORMAL LOW (ref 3.4–5.0)
Anion Gap: 8 (ref 7–16)
BUN: 15 mg/dL (ref 7–18)
Bilirubin,Total: 0.4 mg/dL (ref 0.2–1.0)
CALCIUM: 9.1 mg/dL (ref 8.5–10.1)
Chloride: 105 mmol/L (ref 98–107)
Co2: 26 mmol/L (ref 21–32)
Creatinine: 0.85 mg/dL (ref 0.60–1.30)
EGFR (African American): >60
EGFR (Non-African Amer.): >60
Glucose: 130 mg/dL — ABNORMAL HIGH (ref 65–99)
OSMOLALITY: 280 (ref 275–301)
Potassium: 4.4 mmol/L (ref 3.5–5.1)
SGOT(AST): 34 U/L (ref 15–37)
Sodium: 139 mmol/L (ref 136–145)
TOTAL PROTEIN: 7.5 g/dL (ref 6.4–8.2)

## 2013-12-23 LAB — CBC
HCT: 45.7 % (ref 40.0–52.0)
HGB: 14.5 g/dL (ref 13.0–18.0)
MCH: 28.1 pg (ref 26.0–34.0)
MCHC: 31.8 g/dL — ABNORMAL LOW (ref 32.0–36.0)
MCV: 88 fL (ref 80–100)
Platelet: 273 10*3/uL (ref 150–440)
RBC: 5.17 10*6/uL (ref 4.40–5.90)
RDW: 16.1 % — AB (ref 11.5–14.5)
WBC: 25.6 10*3/uL — ABNORMAL HIGH (ref 3.8–10.6)

## 2013-12-23 LAB — SEDIMENTATION RATE: Erythrocyte Sed Rate: 16 mm/hr (ref 0–20)

## 2013-12-23 LAB — TROPONIN I: Troponin-I: <0.02 ng/mL

## 2013-12-23 LAB — PRO B NATRIURETIC PEPTIDE: B-Type Natriuretic Peptide: 743 pg/mL — ABNORMAL HIGH (ref 0–450)

## 2013-12-24 LAB — COMPREHENSIVE METABOLIC PANEL
ALK PHOS: 62 U/L
ANION GAP: 8 (ref 7–16)
Albumin: 2.3 g/dL — ABNORMAL LOW (ref 3.4–5.0)
BILIRUBIN TOTAL: 0.4 mg/dL (ref 0.2–1.0)
BUN: 12 mg/dL (ref 7–18)
CALCIUM: 8.2 mg/dL — AB (ref 8.5–10.1)
Chloride: 110 mmol/L — ABNORMAL HIGH (ref 98–107)
Co2: 26 mmol/L (ref 21–32)
Creatinine: 1.12 mg/dL (ref 0.60–1.30)
EGFR (African American): >60
GLUCOSE: 90 mg/dL (ref 65–99)
Osmolality: 286 (ref 275–301)
POTASSIUM: 3.8 mmol/L (ref 3.5–5.1)
SGOT(AST): 16 U/L (ref 15–37)
SGPT (ALT): 16 U/L (ref 12–78)
Sodium: 144 mmol/L (ref 136–145)
TOTAL PROTEIN: 5.9 g/dL — AB (ref 6.4–8.2)

## 2013-12-24 LAB — CBC WITH DIFFERENTIAL/PLATELET
BASOS ABS: 0 10*3/uL (ref 0.0–0.1)
Basophil %: 0 %
Eosinophil #: 0 10*3/uL (ref 0.0–0.7)
Eosinophil %: 0 %
HCT: 38.6 % — ABNORMAL LOW (ref 40.0–52.0)
HGB: 12.1 g/dL — AB (ref 13.0–18.0)
Lymphocyte #: 0.4 10*3/uL — ABNORMAL LOW (ref 1.0–3.6)
Lymphocyte %: 2.2 %
MCH: 28 pg (ref 26.0–34.0)
MCHC: 31.3 g/dL — ABNORMAL LOW (ref 32.0–36.0)
MCV: 90 fL (ref 80–100)
MONOS PCT: 8.2 %
Monocyte #: 1.5 x10 3/mm — ABNORMAL HIGH (ref 0.2–1.0)
Neutrophil #: 15.8 10*3/uL — ABNORMAL HIGH (ref 1.4–6.5)
Neutrophil %: 89.6 %
Platelet: 210 10*3/uL (ref 150–440)
RBC: 4.31 10*6/uL — ABNORMAL LOW (ref 4.40–5.90)
RDW: 16.3 % — ABNORMAL HIGH (ref 11.5–14.5)
WBC: 17.7 10*3/uL — ABNORMAL HIGH (ref 3.8–10.6)

## 2013-12-24 LAB — HEMOGLOBIN A1C: Hemoglobin A1C: 6 % (ref 4.2–6.3)

## 2013-12-24 LAB — LIPID PANEL
CHOLESTEROL: 147 mg/dL (ref 0–200)
HDL Cholesterol: 35 mg/dL — ABNORMAL LOW (ref 40–60)
LDL CHOLESTEROL, CALC: 90 mg/dL (ref 0–100)
Triglycerides: 108 mg/dL (ref 0–200)
VLDL CHOLESTEROL, CALC: 22 mg/dL (ref 5–40)

## 2013-12-24 LAB — TROPONIN I: Troponin-I: <0.02 ng/mL

## 2013-12-24 LAB — TSH: Thyroid Stimulating Horm: 3.93 u[IU]/mL

## 2013-12-24 LAB — MAGNESIUM: MAGNESIUM: 1.8 mg/dL

## 2013-12-28 LAB — CULTURE, BLOOD (SINGLE)

## 2014-01-01 ENCOUNTER — Ambulatory Visit: Payer: Medicare Other | Admitting: Internal Medicine

## 2014-01-02 ENCOUNTER — Ambulatory Visit (INDEPENDENT_AMBULATORY_CARE_PROVIDER_SITE_OTHER): Payer: Medicare Other | Admitting: Internal Medicine

## 2014-01-02 ENCOUNTER — Encounter: Payer: Self-pay | Admitting: Internal Medicine

## 2014-01-02 VITALS — BP 110/70 | HR 63 | Temp 98.5°F | Wt 162.0 lb

## 2014-01-02 DIAGNOSIS — F0150 Vascular dementia without behavioral disturbance: Secondary | ICD-10-CM

## 2014-01-02 DIAGNOSIS — E119 Type 2 diabetes mellitus without complications: Secondary | ICD-10-CM

## 2014-01-02 DIAGNOSIS — J438 Other emphysema: Secondary | ICD-10-CM

## 2014-01-02 DIAGNOSIS — G40909 Epilepsy, unspecified, not intractable, without status epilepticus: Secondary | ICD-10-CM

## 2014-01-02 NOTE — Assessment & Plan Note (Signed)
No seizures on his med

## 2014-01-02 NOTE — Assessment & Plan Note (Signed)
Had break and wound [place] in [place] Now in supervised secure [place]---doing well there Not sure namenda really needed---will leave to his new medical team. He is going to switch for in house care

## 2014-01-02 NOTE — Progress Notes (Signed)
Pre visit review using our clinic review tool, if applicable. No additional management support is needed unless otherwise documented below in the visit note. 

## 2014-01-02 NOTE — Patient Instructions (Signed)
Please set up with care from [facility]

## 2014-01-02 NOTE — Assessment & Plan Note (Signed)
Lab Results  Component Value Date   HGBA1C 5.9 10/29/2013   Has had good control without meds

## 2014-01-02 NOTE — Assessment & Plan Note (Signed)
Recent respiratory illness Doing better now

## 2014-01-02 NOTE — Progress Notes (Signed)
   Subjective:    Patient ID: [name], male    DOB: 08/14/1934, 79 y.o.   MRN: [id number]  HPI Here with aide [facility]  Had been found confused [place] in [place] Admitted to hospital there---transferred [facility] and finally transferred to memory care unit [facility] He likes it there  Recent Rx for pneumonia ---seen in hospital Just finished levaquin  No seizures noted since I last saw him Remains on his meds  No meds for diabetes still Hasn't been being checked  No chest pain Some SOB and wheezing after last hospital stay--this is better now  Current Outpatient Prescriptions on File Prior to Visit  Medication Sig Dispense Refill  . gabapentin (NEURONTIN) 100 MG capsule TAKE 1 CAPSULE (100 MG TOTAL) BY MOUTH 3 (THREE) TIMES DAILY.  90 capsule  9   No current facility-administered medications on file prior to visit.    No Known Allergies  Past Medical History  Diagnosis Date  . COPD (chronic obstructive pulmonary disease)   . Diabetes mellitus   . Hypertension   . Aortic dissection     type B  . Deep vein thrombosis     history of  . Renal insufficiency   . Movement disorder   . Seizure disorder   . Essential hypertension, benign 11/11/2013  . Vascular dementia of acute onset without behavioral disturbance     Past Surgical History  Procedure Laterality Date  . Appendectomy    . Hernia repair    . Pelvic and para-aortic lymph node dissection      type B    Family History  Problem Relation Age of Onset  . Alzheimer's disease Mother   . Hypertension Mother   . Heart disease Father   . COPD Brother     History   Social History  . Marital Status: Single    Spouse Name: N/A    Number of Children: N/A  . Years of Education: N/A   Occupational History  . Retired    Social History Main Topics  . Smoking status: Former Smoker -- 1.00 packs/day for 30 years    Types: Cigarettes    Quit date: 06/28/1980  . Smokeless tobacco: Never  Used  . Alcohol Use: No  . Drug Use: No  . Sexual Activity: Not on file   Other Topics Concern  . Not on file   Social History Narrative   No living will   Requests nephew Isaac LaudRay Fuhrman to be health care POA.   Requests DNR after discussion. Written 10/17/12   Would not want tube feedings if cognitively unaware   Review of Systems Appetite is good Weight is stable Sleeps okay    Objective:   Physical Exam  Constitutional: He appears well-developed and well-nourished. No distress.  Neck: Normal range of motion. Neck supple.  Cardiovascular: Normal rate, regular rhythm and normal heart sounds.  Exam reveals no gallop.   No murmur heard. Pulmonary/Chest: Effort normal and breath sounds normal. No respiratory distress. He has no wheezes. He has no rales.  Abdominal: Soft. There is no tenderness.  Musculoskeletal: He exhibits no edema and no tenderness.  Lymphadenopathy:    He has no cervical adenopathy.  Psychiatric: He has a normal mood and affect. His behavior is normal.          Assessment & Plan:

## 2014-01-16 ENCOUNTER — Telehealth: Payer: Self-pay

## 2014-01-16 NOTE — Telephone Encounter (Signed)
[name] pts nephew wants information about pt's last visit. No DPR signed. [name] has healthcare and durable power of attorney papers that he will bring to office for scanning. [name] said it is not urgent to leave more detailed message now and he will cb after bringing POA.

## 2014-02-27 ENCOUNTER — Emergency Department: Payer: Self-pay | Admitting: Emergency Medicine

## 2014-02-27 LAB — URINALYSIS, COMPLETE
BACTERIA: NONE SEEN
Bilirubin,UR: NEGATIVE
Glucose,UR: NEGATIVE mg/dL (ref 0–75)
LEUKOCYTE ESTERASE: NEGATIVE
Nitrite: NEGATIVE
Ph: 6 (ref 4.5–8.0)
Protein: NEGATIVE
RBC,UR: 82 /HPF (ref 0–5)
Specific Gravity: 1.024 (ref 1.003–1.030)
Squamous Epithelial: NONE SEEN
WBC UR: 1 /HPF (ref 0–5)

## 2014-02-27 LAB — CBC
HCT: 43.1 % (ref 40.0–52.0)
HGB: 13.9 g/dL (ref 13.0–18.0)
MCH: 28.7 pg (ref 26.0–34.0)
MCHC: 32.2 g/dL (ref 32.0–36.0)
MCV: 89 fL (ref 80–100)
Platelet: 144 10*3/uL — ABNORMAL LOW (ref 150–440)
RBC: 4.83 10*6/uL (ref 4.40–5.90)
RDW: 16.1 % — AB (ref 11.5–14.5)
WBC: 6.1 10*3/uL (ref 3.8–10.6)

## 2014-02-27 LAB — COMPREHENSIVE METABOLIC PANEL
AST: 13 U/L — AB (ref 15–37)
Albumin: 2.5 g/dL — ABNORMAL LOW (ref 3.4–5.0)
Alkaline Phosphatase: 84 U/L
Anion Gap: 8 (ref 7–16)
BILIRUBIN TOTAL: 0.3 mg/dL (ref 0.2–1.0)
BUN: 21 mg/dL — ABNORMAL HIGH (ref 7–18)
CHLORIDE: 109 mmol/L — AB (ref 98–107)
CO2: 25 mmol/L (ref 21–32)
Calcium, Total: 8.5 mg/dL (ref 8.5–10.1)
Creatinine: 1.04 mg/dL (ref 0.60–1.30)
EGFR (African American): >60
EGFR (Non-African Amer.): >60
Glucose: 195 mg/dL — ABNORMAL HIGH (ref 65–99)
Osmolality: 291 (ref 275–301)
Potassium: 3.9 mmol/L (ref 3.5–5.1)
SGPT (ALT): 13 U/L — ABNORMAL LOW
SODIUM: 142 mmol/L (ref 136–145)
TOTAL PROTEIN: 7.1 g/dL (ref 6.4–8.2)

## 2014-02-27 LAB — TROPONIN I: Troponin-I: <0.02 ng/mL

## 2014-04-09 ENCOUNTER — Emergency Department: Payer: Self-pay | Admitting: Emergency Medicine

## 2014-04-09 LAB — CBC
HCT: 40.1 % (ref 40.0–52.0)
HGB: 12.8 g/dL — ABNORMAL LOW (ref 13.0–18.0)
MCH: 28.8 pg (ref 26.0–34.0)
MCHC: 31.8 g/dL — ABNORMAL LOW (ref 32.0–36.0)
MCV: 90 fL (ref 80–100)
Platelet: 173 10*3/uL (ref 150–440)
RBC: 4.43 10*6/uL (ref 4.40–5.90)
RDW: 15.9 % — ABNORMAL HIGH (ref 11.5–14.5)
WBC: 6.9 10*3/uL (ref 3.8–10.6)

## 2014-04-09 LAB — COMPREHENSIVE METABOLIC PANEL
ALBUMIN: 2.9 g/dL — AB (ref 3.4–5.0)
ALK PHOS: 78 U/L
Anion Gap: 4 — ABNORMAL LOW (ref 7–16)
BUN: 16 mg/dL (ref 7–18)
Bilirubin,Total: 0.3 mg/dL (ref 0.2–1.0)
CREATININE: 1.07 mg/dL (ref 0.60–1.30)
Calcium, Total: 8.6 mg/dL (ref 8.5–10.1)
Chloride: 106 mmol/L (ref 98–107)
Co2: 30 mmol/L (ref 21–32)
EGFR (African American): >60
Glucose: 88 mg/dL (ref 65–99)
Osmolality: 280 (ref 275–301)
Potassium: 4.8 mmol/L (ref 3.5–5.1)
SGOT(AST): 23 U/L (ref 15–37)
SGPT (ALT): 16 U/L
SODIUM: 140 mmol/L (ref 136–145)
Total Protein: 7.2 g/dL (ref 6.4–8.2)

## 2014-04-09 LAB — URINALYSIS, COMPLETE
BACTERIA: NONE SEEN
BILIRUBIN, UR: NEGATIVE
Blood: NEGATIVE
Glucose,UR: NEGATIVE mg/dL (ref 0–75)
Ketone: NEGATIVE
Leukocyte Esterase: NEGATIVE
Nitrite: NEGATIVE
PH: 5 (ref 4.5–8.0)
Protein: NEGATIVE
SPECIFIC GRAVITY: 1.014 (ref 1.003–1.030)
Squamous Epithelial: NONE SEEN

## 2014-04-09 LAB — TROPONIN I

## 2014-04-09 LAB — VALPROIC ACID LEVEL

## 2014-05-01 ENCOUNTER — Ambulatory Visit: Payer: Medicare Other | Admitting: Internal Medicine

## 2014-05-02 ENCOUNTER — Telehealth: Payer: Self-pay | Admitting: Internal Medicine

## 2014-05-02 NOTE — Telephone Encounter (Signed)
Patient did not come for their scheduled appointment 05/01/14 for 6 month follow up.  Please let me know if the patient needs to be contacted immediately for follow up or if no follow up is necessary.

## 2014-05-02 NOTE — Telephone Encounter (Signed)
[place] has switched [place]

## 2014-07-23 ENCOUNTER — Emergency Department: Payer: Self-pay | Admitting: Internal Medicine

## 2014-07-23 LAB — URINALYSIS, COMPLETE
Bacteria: NONE SEEN
Bilirubin,UR: NEGATIVE
Blood: NEGATIVE
Glucose,UR: NEGATIVE mg/dL (ref 0–75)
Hyaline Cast: 2
Leukocyte Esterase: NEGATIVE
Nitrite: NEGATIVE
PROTEIN: NEGATIVE
Ph: 6 (ref 4.5–8.0)
RBC,UR: 4 /HPF (ref 0–5)
Specific Gravity: 1.018 (ref 1.003–1.030)
Squamous Epithelial: <1
WBC UR: 1 /HPF (ref 0–5)

## 2014-07-23 LAB — CBC
HCT: 39.2 % — ABNORMAL LOW (ref 40.0–52.0)
HGB: 12.9 g/dL — ABNORMAL LOW (ref 13.0–18.0)
MCH: 30.1 pg (ref 26.0–34.0)
MCHC: 32.9 g/dL (ref 32.0–36.0)
MCV: 92 fL (ref 80–100)
PLATELETS: 192 10*3/uL (ref 150–440)
RBC: 4.29 10*6/uL — ABNORMAL LOW (ref 4.40–5.90)
RDW: 15.5 % — AB (ref 11.5–14.5)
WBC: 9.1 10*3/uL (ref 3.8–10.6)

## 2014-07-23 LAB — COMPREHENSIVE METABOLIC PANEL
ALBUMIN: 2.7 g/dL — AB (ref 3.4–5.0)
ANION GAP: 6 — AB (ref 7–16)
Alkaline Phosphatase: 69 U/L (ref 46–116)
BILIRUBIN TOTAL: 0.3 mg/dL (ref 0.2–1.0)
BUN: 23 mg/dL — AB (ref 7–18)
CREATININE: 1.24 mg/dL (ref 0.60–1.30)
Calcium, Total: 9 mg/dL (ref 8.5–10.1)
Chloride: 105 mmol/L (ref 98–107)
Co2: 29 mmol/L (ref 21–32)
EGFR (African American): >60
EGFR (Non-African Amer.): >60
Glucose: 116 mg/dL — ABNORMAL HIGH (ref 65–99)
OSMOLALITY: 284 (ref 275–301)
POTASSIUM: 4.4 mmol/L (ref 3.5–5.1)
SGOT(AST): 17 U/L (ref 15–37)
SGPT (ALT): 15 U/L (ref 14–63)
SODIUM: 140 mmol/L (ref 136–145)
TOTAL PROTEIN: 6.9 g/dL (ref 6.4–8.2)

## 2014-07-23 LAB — CK TOTAL AND CKMB (NOT AT ARMC)
CK, TOTAL: 85 U/L (ref 39–308)
CK-MB: 2 ng/mL (ref 0.5–3.6)

## 2014-07-23 LAB — VALPROIC ACID LEVEL: Valproic Acid: 62 ug/mL

## 2014-07-23 LAB — LIPASE, BLOOD: Lipase: 81 U/L (ref 73–393)

## 2014-07-23 LAB — AMMONIA: Ammonia, Plasma: 25 mcmol/L (ref 11–32)

## 2014-07-23 LAB — TROPONIN I: Troponin-I: <0.02 ng/mL

## 2014-10-19 NOTE — Discharge Summary (Signed)
PATIENT NAME:  Brian HumanCLAPP, Jaiyden MR#:  [id number] DATE OF BIRTH:  [birth year]  DATE OF ADMISSION:  12/23/2013 DATE OF DISCHARGE:  12/25/2013   ADMISSION DIAGNOSES:  1. Altered mental status. 2. Aortic dissection, type II.  3. Sepsis. 4. Dementia.  DISCHARGE DIAGNOSES:  1. Type II aortic dissection.  2. Sepsis from pneumonia.  3. Dementia.  4. Pneumonia.   CONSULTATIONS: None.   IMAGING:  The patient had a CT scan on admission which was positive for type II aortic dissection distal to the left subclavian artery, extending to the left common iliac, new from 2006. CT of the head was negative.   DISCHARGE LABORATORIES:  White blood cells 17, hemoglobin 12, hematocrit 39, platelets are 210.  Sodium 144, potassium 3.8, chloride 110, bicarbonate 26, BUN 12, creatinine 1.12, glucose is 90.  Blood cultures negative to date.   HOSPITAL COURSE: This is a 80 year old male who presented with altered mental status, found to have sepsis and pneumonia, and incidentally, a widened mediastinum on chest x-ray, which led to a CT scan, which showed a type II dissection. For further details, please refer to H and P.   1. Type II aortic dissection. This is not a surgical type of dissection. This is predominantly handled by blood pressure management, goal blood pressure being less than 120 as tolerated and heart rate being less than at least 70. He was initially placed on esmolol drip. This was discontinued. He is on metoprolol, which has been titrated up and will need close followup and monitoring of his blood pressure and heart rate. Goal is blood pressure less than 120/80.  2. Dementia. The patient will continue outpatient medication.  3. Sepsis from presumed pneumonia. Blood cultures negative to date. He is on Levaquin.  4. Pneumonia. The patient was on Levaquin. His blood cultures are negative to date. His white blood cell count did improve, and since it has been improving, I did not repeat the CBC.    DISCHARGE MEDICATIONS:  1. Aspirin 81 mg daily.  2. Depakote 500 mg q.12 hours.  3. Gabapentin 100 mg t.i.d.  4. Namenda 10 mg b.i.d.  5. Omeprazole 20 mg daily.  6. Melatonin 1 mg at bedtime.  7. Metoprolol 25 b.i.d.  8. Levaquin 750 mg daily for 7 days.   DISCHARGE DIET: Low sodium.   DISCHARGE ACTIVITY: No exertion, no heavy lifting.   DISCHARGE DIET: Low sodium, regular consistency.   DISCHARGE FOLLOWUP:  1. In 1 week with Dr. Gwen PoundsKowalski.  2. In 1 week with Dr. Alphonsus SiasLetvak.  3. Check blood pressure b.i.d. If systolic blood pressure greater than 130 or diastolic greater than 90, call MD.  CODE STATUS: The patient is DNR status.  CONDITION: Stable for discharge.   TIME SPENT: Approximately 40 minutes on discharge.   ____________________________ Sital P. Juliene PinaMody, MD spm:lb D: 12/25/2013 10:35:50 ET T: 12/25/2013 10:50:03 ET JOB#: [id number]  cc: Sital P. Juliene PinaMody, MD, <Dictator> Janyth ContesSITAL P MODY MD ELECTRONICALLY SIGNED 12/25/2013 12:26

## 2014-10-19 NOTE — H&P (Signed)
PATIENT NAME:  [name], [name] MR#:  [id number] DATE OF BIRTH:  1934-11-13  DATE OF ADMISSION:  12/23/2013  REASON FOR ADMISSION: Altered mental status, found down now with aortic dissection.   PRIMARY CARE PHYSICIAN: Dr. Alphonsus SiasLetvak.   REFERRING PHYSICIAN: Dr. Fanny BienQuale.   HISTORY OF PRESENT ILLNESS: This is a nice 80 year old gentleman who has been admitted to the hospital today after he was found down by the personnel at the level of the nursing home where he resides. The patient was overall doing okay, being his normal self without any significant changes in his condition until today when he was found on the floor. The patient is demented and not able to give any information. Apparently, the patient has been more lethargic today than usual and less responsive than he always is. The patient looks more confused as per family. He has been treated for hypertension in the past, although he is no longer on any medications. Within the last several months the patient was diagnosed with dementia. Apparently, he was living by himself, doing well up until his sister-in-law passed away. His sister-in-law what the main source of comfort he had, the main family support there was available for him, but when she passed it triggered changes in his mental status to the point that [place] was [place] he got lost driving around the area on [address], he ended up on [place] without any idea of how he got there.  The patient has been in the nursing facility since then and today after he was found down, he was evaluated in the Emergency Department. He had a CT scan of the head that did not show any significant abnormalities. Urinalysis today did not show any signs of infection, although he had a white blood count of 25,000. A chest x-ray was done and the chest x-ray had a widened mediastinum. He had a CT of the chest and the CT of the chest with angiogram showed a type B aortic dissection involving the thoracic and  abdominal aorta. Pretty much the dissection began at the level of the subclavian down into the common iliac. He does have ananeurysmatci dilation of the  aortic arch, descending thoracic aorta, and suprarenal abdominal aortic. None of these findings where seen on 2006, whenever he had a CAT scan.   There are some changes on the left posterior lung base that could be related to pneumonitis versus beginning of pneumonia or sequels of aspiration.   There is some wall thickening at the level of the ileum without any surrounding inflammatory symptoms. So the patient is admitted for treatment of aortic dissection medically. The family does not want any heroics. They do not want any surgery at this moment. Vascular surgery was consulted and they agree with the family at this moment. They would like to have medical management alone. The patient is admitted for that to the critical care unit.   REVIEW OF SYSTEMS: A 12-system review of systems unable to obtain as the patient has significant dementia and is more confused today.  PAST MEDICAL HISTORY: Apparently he has a history of seizure disorder, but he has not had a seizure in years. He has vascular dementia. He has generalized weakness and hypertension, which he is not on medications for.   ALLERGIES: Not known drug allergies.   SURGICAL HISTORY: Hernioplasty.   FAMILY HISTORY: His father had an MI. His mother died from old age and dementia.   SOCIAL HISTORY: The patient never got married, does not  have any kids. His power of attorney is his nephew. 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 Apparently as far as the family can tell me, they were not aware of any smoking or any alcohol. No drugs.   MEDICATIONS: The medications that he was taking currently were: Aspirin 81 mg daily, Prilosec 20 mg daily, Depakote 500 mg twice daily, gabapentin 100 mg 3 times a day, melatonin 1 mg at night, and Namenda 10 mg twice  daily.   PHYSICAL EXAMINATION:  VITAL SIGNS: Blood pressure at this moment is 108/71, pulse is in between 95 and 109. Temperature 98.5, respirations in between 17-20, oxygen saturation 93% on room air.  GENERAL: The patient is awake, but he is lethargic. He is confused. He is not talking.  HEENT: His pupils are equal and reactive. Extraocular movements are intact. Mucosae are dry. Anicteric sclerae. Pink conjunctivae. There is a significant amount of phlegm on the oropharynx. It is thick and it is smelly and green.  NECK: Supple. No JVD. No thyromegaly. No adenopathy. No carotid bruits.  CARDIOVASCULAR: Regular rate and rhythm. No murmurs, rubs, or gallops. Very distant heart tones. No displacement of PMI.  LUNGS: Showing some crackles and rales at the level of both bases. No use of accessory muscles.  ABDOMEN: Soft, nontender, nondistended. No hepatosplenomegaly. No masses. Bowel sounds are positive.  GENITAL: Negative for external lesions.  EXTREMITIES: No edema, cyanosis or clubbing.  VASCULAR: Pulses +2 equally at the level of the femoral, popliteal, and slightly decreased at the level of the anterior perineum and posterior tibialis. SKIN: The patient has pale skin but no cyanosis. EXTREMITIES: (no acrocyanosis.  NEUROLOGIC: Cranial nerves II-XII intact. The patient is confused, not very cooperative.  PSYCHIATRIC: Again confused, nonverbal, unable to fully assess.  NEUROLOGIC: Strength is equal in all 4 extremities. Sensation is normal. He withdraws to pain.  LYMPHATIC: Negative for lymphadenopathy in the neck or supraclavicular areas.  SKIN: No rashes or petechiae.  LABORATORY DATA: CT scan of the head: Atrophy and chronic ischemic white matter disease without acute problems. CT of the chest as mentioned above.   White blood count 26,000, lactic acid 1.5, hemoglobin 14.5, albumin 2.8, potassium 4.4. His glucose is 130. BNP is 743, creatinine 0.85.   EKG: Sinus tachycardia, (no infarct.  No acute ST depression or elevation.   ASSESSMENT AND PLAN: A 80 year old gentleman with dementia, hypertension, but not on treatment comes in with being found down. On evaluation he had an elevation of white blood cell count and a widened mediastinum.  1. Altered mental status. The patient has what appears to be metabolic encephalopathy secondary to the possible infection and sepsis. The patient had an infiltrate or beginning of an infiltrate noticed on the CT scan likely this is community-acquired pneumonia. The blood cultures taken. A sputum culture is ordered  2. Aortic dissection. The patient has type B aortic dissection which elongates from the arch and origin of the subclavian at the level of the iliac. At this moment the patient does not have any of  the characteristics of a dissection. His blood pressure is overall normal although he is very tachycardic. The thing about him is that he is demented and he is not able to really tell us what has been going on. We do not know if he has had any chest pain recently and we do not know if he has been short of breath or any other changes. The  patient will not to have surgery, as the family does not want any heroics. We are going to treat him medically and they agree with this. At this moment, we are going to start him on esmolol, try to keep his blood pressure under parameters of 100 to 120 systolic and his heart rate below 60 if possible. Right now he is tachycardic. We are meeting criteria with his blood pressure at his normal which indicates that probably this problem has been going on for a while, in general. As far as the diagnostics, rule out the possibility of arthritis with rheumatoid factor, RPR. Rule out the possibility of syphilis. The patient does not have Marfan's syndrome or at least he does not have any Marfanoid characteristics and there has not been any trauma. Apparently, he was never a smoker. Continue to monitor closely get an echocardiogram  and ask cardiac consultation. Vascular has been already consulted and since there is no surgical management, we will just have to call them as needed. 3. Sepsis. As far as the sepsis, the patient has a white count of 25,000, changes on the CT scan related to pneumonia and severely tachycardic. The patient had blood cultures taken and sputum cultures ordered started on Levaquin for treatment of community-acquired pneumonia.  4. Dementia. Continue Namenda p.r.n., Xanax, or Ativan. 5. Deep vein thrombosis prophylaxis pneumatic compression devices. Deep vein thrombosis prophylaxis with Lovenox.   The patient has a guarded prognosis with high possibility of cardiovascular collapse and death.   CRITICAL CARE TIME: 50 minutes.   The patient is DNR right now.    ____________________________ Felipa Furnace, MD rsg:lt D: 12/23/2013 21:06:47 ET T: 12/23/2013 22:54:43 ET JOB#: [id number]  cc: Felipa Furnace, MD, <Dictator> ROBERTO Juanda Chance MD ELECTRONICALLY SIGNED 01/10/2014 1:47

## 2015-04-16 ENCOUNTER — Encounter: Payer: Self-pay | Admitting: Emergency Medicine

## 2015-04-16 ENCOUNTER — Observation Stay
Admission: EM | Admit: 2015-04-16 | Discharge: 2015-04-20 | Payer: Medicare Other | Attending: Specialist | Admitting: Specialist

## 2015-04-16 ENCOUNTER — Emergency Department: Payer: Medicare Other

## 2015-04-16 DIAGNOSIS — Z79899 Other long term (current) drug therapy: Secondary | ICD-10-CM | POA: Insufficient documentation

## 2015-04-16 DIAGNOSIS — Z66 Do not resuscitate: Secondary | ICD-10-CM | POA: Diagnosis not present

## 2015-04-16 DIAGNOSIS — I959 Hypotension, unspecified: Secondary | ICD-10-CM | POA: Insufficient documentation

## 2015-04-16 DIAGNOSIS — G9341 Metabolic encephalopathy: Secondary | ICD-10-CM | POA: Insufficient documentation

## 2015-04-16 DIAGNOSIS — Z825 Family history of asthma and other chronic lower respiratory diseases: Secondary | ICD-10-CM | POA: Insufficient documentation

## 2015-04-16 DIAGNOSIS — G259 Extrapyramidal and movement disorder, unspecified: Secondary | ICD-10-CM | POA: Insufficient documentation

## 2015-04-16 DIAGNOSIS — R197 Diarrhea, unspecified: Secondary | ICD-10-CM | POA: Diagnosis not present

## 2015-04-16 DIAGNOSIS — Z8249 Family history of ischemic heart disease and other diseases of the circulatory system: Secondary | ICD-10-CM | POA: Insufficient documentation

## 2015-04-16 DIAGNOSIS — W06XXXA Fall from bed, initial encounter: Secondary | ICD-10-CM | POA: Insufficient documentation

## 2015-04-16 DIAGNOSIS — Z9049 Acquired absence of other specified parts of digestive tract: Secondary | ICD-10-CM | POA: Insufficient documentation

## 2015-04-16 DIAGNOSIS — E860 Dehydration: Secondary | ICD-10-CM | POA: Diagnosis not present

## 2015-04-16 DIAGNOSIS — I10 Essential (primary) hypertension: Secondary | ICD-10-CM | POA: Insufficient documentation

## 2015-04-16 DIAGNOSIS — E1165 Type 2 diabetes mellitus with hyperglycemia: Secondary | ICD-10-CM | POA: Insufficient documentation

## 2015-04-16 DIAGNOSIS — R4182 Altered mental status, unspecified: Secondary | ICD-10-CM | POA: Diagnosis not present

## 2015-04-16 DIAGNOSIS — Z9889 Other specified postprocedural states: Secondary | ICD-10-CM | POA: Insufficient documentation

## 2015-04-16 DIAGNOSIS — Z86718 Personal history of other venous thrombosis and embolism: Secondary | ICD-10-CM | POA: Diagnosis not present

## 2015-04-16 DIAGNOSIS — J449 Chronic obstructive pulmonary disease, unspecified: Secondary | ICD-10-CM | POA: Insufficient documentation

## 2015-04-16 DIAGNOSIS — Z7982 Long term (current) use of aspirin: Secondary | ICD-10-CM | POA: Diagnosis not present

## 2015-04-16 DIAGNOSIS — R401 Stupor: Secondary | ICD-10-CM

## 2015-04-16 DIAGNOSIS — E871 Hypo-osmolality and hyponatremia: Secondary | ICD-10-CM | POA: Insufficient documentation

## 2015-04-16 DIAGNOSIS — Z87891 Personal history of nicotine dependence: Secondary | ICD-10-CM | POA: Insufficient documentation

## 2015-04-16 DIAGNOSIS — F0150 Vascular dementia without behavioral disturbance: Secondary | ICD-10-CM | POA: Insufficient documentation

## 2015-04-16 DIAGNOSIS — G40909 Epilepsy, unspecified, not intractable, without status epilepticus: Secondary | ICD-10-CM | POA: Insufficient documentation

## 2015-04-16 DIAGNOSIS — Z820 Family history of epilepsy and other diseases of the nervous system: Secondary | ICD-10-CM | POA: Diagnosis not present

## 2015-04-16 DIAGNOSIS — R5383 Other fatigue: Principal | ICD-10-CM | POA: Insufficient documentation

## 2015-04-16 DIAGNOSIS — R531 Weakness: Secondary | ICD-10-CM | POA: Diagnosis not present

## 2015-04-16 DIAGNOSIS — R05 Cough: Secondary | ICD-10-CM | POA: Insufficient documentation

## 2015-04-16 DIAGNOSIS — R0902 Hypoxemia: Secondary | ICD-10-CM | POA: Diagnosis not present

## 2015-04-16 DIAGNOSIS — N289 Disorder of kidney and ureter, unspecified: Secondary | ICD-10-CM | POA: Diagnosis not present

## 2015-04-16 DIAGNOSIS — K219 Gastro-esophageal reflux disease without esophagitis: Secondary | ICD-10-CM | POA: Diagnosis not present

## 2015-04-16 DIAGNOSIS — L8990 Pressure ulcer of unspecified site, unspecified stage: Secondary | ICD-10-CM | POA: Diagnosis not present

## 2015-04-16 LAB — URINALYSIS COMPLETE WITH MICROSCOPIC (ARMC ONLY)
BACTERIA UA: NONE SEEN
Bilirubin Urine: NEGATIVE
GLUCOSE, UA: NEGATIVE mg/dL
HGB URINE DIPSTICK: NEGATIVE
LEUKOCYTES UA: NEGATIVE
Nitrite: NEGATIVE
PH: 7 (ref 5.0–8.0)
PROTEIN: NEGATIVE mg/dL
SQUAMOUS EPITHELIAL / LPF: NONE SEEN
Specific Gravity, Urine: 1.012 (ref 1.005–1.030)

## 2015-04-16 LAB — BASIC METABOLIC PANEL
Anion gap: 9 (ref 5–15)
BUN: 18 mg/dL (ref 6–20)
CHLORIDE: 100 mmol/L — AB (ref 101–111)
CO2: 24 mmol/L (ref 22–32)
Calcium: 8.9 mg/dL (ref 8.9–10.3)
Creatinine, Ser: 0.97 mg/dL (ref 0.61–1.24)
GFR calc Af Amer: >60 mL/min (ref >60)
GFR calc non Af Amer: >60 mL/min (ref >60)
Glucose, Bld: 127 mg/dL — ABNORMAL HIGH (ref 65–99)
POTASSIUM: 4.2 mmol/L (ref 3.5–5.1)
SODIUM: 133 mmol/L — AB (ref 135–145)

## 2015-04-16 LAB — CBC
HEMATOCRIT: 39.6 % — AB (ref 40.0–52.0)
HEMOGLOBIN: 13.2 g/dL (ref 13.0–18.0)
MCH: 30 pg (ref 26.0–34.0)
MCHC: 33.3 g/dL (ref 32.0–36.0)
MCV: 90.1 fL (ref 80.0–100.0)
Platelets: 164 10*3/uL (ref 150–440)
RBC: 4.39 MIL/uL — ABNORMAL LOW (ref 4.40–5.90)
RDW: 14.9 % — AB (ref 11.5–14.5)
WBC: 8.6 10*3/uL (ref 3.8–10.6)

## 2015-04-16 LAB — LACTIC ACID, PLASMA: Lactic Acid, Venous: 1.3 mmol/L (ref 0.5–2.0)

## 2015-04-16 LAB — TROPONIN I

## 2015-04-16 LAB — AMMONIA: AMMONIA: 28 umol/L (ref 9–35)

## 2015-04-16 MED ORDER — SODIUM CHLORIDE 0.9 % IV BOLUS (SEPSIS)
1000.0000 mL | Freq: Once | INTRAVENOUS | Status: AC
  Administered 2015-04-16: 1000 mL via INTRAVENOUS

## 2015-04-16 NOTE — ED Notes (Signed)
Spoke with Morrie Sheldonshley @ [facility] [phone], who explained that the patient's baseline is normally alert and oriented to person and place.  She explained that he normally is awake and answering questions while carrying on full conversations.

## 2015-04-16 NOTE — ED Provider Notes (Signed)
Time Seen: Approximately 1900  I have reviewed the triage notes  Chief Complaint: Hypotension and Weakness   History of Present Illness: [name] is a 80 y.o. male who is transferred here by EMS for evaluation of altered mental status along with hypotension. The patient is able to respond to some verbal commands and painful stimuli but then goes back to a stuporous state. He denies any obvious pain. Otherwise very limited historian. Initial blood pressures here are low. No record of a fever or cough or trauma noted from the nursing facility. He does have a history of dementia and according to the facility through the nursing staff patient usually much more alert and interactive. No obvious change in his medications. He does have a remote history of a seizure disorder   Past Medical History  Diagnosis Date  . COPD (chronic obstructive pulmonary disease) (HCC)   . Diabetes mellitus   . Hypertension   . Aortic dissection (HCC)     type B  . Deep vein thrombosis (HCC)     history of  . Renal insufficiency   . Movement disorder   . Seizure disorder (HCC)   . Essential hypertension, benign 11/11/2013  . Vascular dementia of acute onset without behavioral disturbance     Patient Active Problem List   Diagnosis Date Noted  . Essential hypertension, benign 11/11/2013  . GERD (gastroesophageal reflux disease) 11/11/2013  . Vascular dementia without behavioral disturbance 11/11/2013  . Routine general medical examination at a health care facility 10/17/2012  . B12 DEFICIENCY 04/02/2009  . Seizure disorder (HCC) 04/02/2009  . DIABETES MELLITUS, TYPE II 09/26/2006  . COPD (chronic obstructive pulmonary disease) (HCC) 09/26/2006    Past Surgical History  Procedure Laterality Date  . Appendectomy    . Hernia repair    . Pelvic and para-aortic lymph node dissection      type B    Past Surgical History  Procedure Laterality Date  . Appendectomy    . Hernia repair    . Pelvic and  para-aortic lymph node dissection      type B    Current Outpatient Rx  Name  Route  Sig  Dispense  Refill  . acetaminophen (TYLENOL) 500 MG tablet   Oral   Take 1,000 mg by mouth 3 (three) times daily. Pt also takes every six hours PRN.         Marland Kitchen aspirin EC 81 MG tablet   Oral   Take 81 mg by mouth daily.         Marland Kitchen azithromycin (ZITHROMAX) 250 MG tablet   Oral   Take 250-500 mg by mouth daily. Pt takes two tablets on day 1 then one tablet for the next four days.         . divalproex (DEPAKOTE SPRINKLE) 125 MG capsule   Oral   Take 500 mg by mouth 2 (two) times daily.         Marland Kitchen gabapentin (NEURONTIN) 100 MG capsule   Oral   Take 100 mg by mouth 3 (three) times daily.         Marland Kitchen guaifenesin (ROBITUSSIN) 100 MG/5ML syrup   Oral   Take 200 mg by mouth 3 (three) times daily.          Marland Kitchen loperamide (IMODIUM) 2 MG capsule   Oral   Take 2-4 mg by mouth as needed for diarrhea or loose stools.         . Melatonin 1 MG  TABS   Oral   Take 1 mg by mouth at bedtime.         . memantine (NAMENDA) 10 MG tablet   Oral   Take 10 mg by mouth 2 (two) times daily.         . metoprolol tartrate (LOPRESSOR) 25 MG tablet   Oral   Take 25 mg by mouth 2 (two) times daily.         Marland Kitchen omeprazole (PRILOSEC) 20 MG capsule   Oral   Take 20 mg by mouth daily.         . Skin Protectants, Misc. (ENDIT EX)   Apply externally   Apply 1 application topically 2 (two) times daily as needed (for irritation).           Allergies:  Review of patient's allergies indicates no known allergies.  Family History: Family History  Problem Relation Age of Onset  . Alzheimer's disease Mother   . Hypertension Mother   . Heart disease Father   . COPD Brother     Social History: Social History  Substance Use Topics  . Smoking status: Former Smoker -- 1.00 packs/day for 30 years    Types: Cigarettes    Quit date: 06/28/1980  . Smokeless tobacco: Never Used  . Alcohol Use: No      Review of Systems:   10 point review of systems was performed and was otherwise negative:  Constitutional: No fever Eyes: No visual disturbances ENT: No sore throat, ear pain Cardiac: No chest pain Respiratory: No shortness of breath, wheezing, or stridor Abdomen: No abdominal pain, no vomiting, No diarrhea Endocrine: No weight loss, No night sweats Extremities: No peripheral edema, cyanosis Skin: No rashes, easy bruising Neurologic: No focal weakness, trouble with speech or swollowing Urologic: No dysuria, Hematuria, or urinary frequency Review of systems is mainly acquired from the medical record, EMS, and some from the patient.  Physical Exam:  ED Triage Vitals  Enc Vitals Group     BP 04/16/15 1706 90/65 mmHg     Pulse Rate 04/16/15 1706 84     Resp 04/16/15 1706 20     Temp 04/16/15 1706 97.8 F (36.6 C)     Temp Source 04/16/15 1706 Axillary     SpO2 04/16/15 1706 91 %     Weight 04/16/15 1717 162 lb 14.7 oz (73.9 kg)     Height 04/16/15 1706  (1.803 m)     Head Cir --      Peak Flow --      Pain Score --      Pain Loc --      Pain Edu? --      Excl. in GC? --     General: Awake , Alert , and Oriented times 1 occasionally only responds to painful stimuli. Head: Normal cephalic , atraumatic Eyes: Pupils equal , round, reactive to light Nose/Throat: Dry mucous membranes No nasal drainage, patent upper airway without erythema or exudate.  Neck: Supple, Full range of motion, No anterior adenopathy or palpable thyroid masses Lungs: Diminished bilaterally at the bases with some mild rhonchi on initial exam at the right base, no wheezes or rales are noted Heart: Regular rate, regular rhythm without murmurs , gallops , or rubs Abdomen: Soft, non tender without rebound, guarding , or rigidity; bowel sounds positive and symmetric in all 4 quadrants. No organomegaly .        Extremities: 2 plus symmetric pulses. No edema, clubbing or  cyanosis Neurologic: No  obvious focal deficits patient does occasionally squeeze his hands and will move both lower extremities.  Skin: warm, dry, no rashes   Labs:   All laboratory work was reviewed including any pertinent negatives or positives listed below:  Labs Reviewed  BASIC METABOLIC PANEL - Abnormal; Notable for the following:    Sodium 133 (*)    Chloride 100 (*)    Glucose, Bld 127 (*)    All other components within normal limits  CBC - Abnormal; Notable for the following:    RBC 4.39 (*)    HCT 39.6 (*)    RDW 14.9 (*)    All other components within normal limits  URINALYSIS COMPLETEWITH MICROSCOPIC ([facility] ONLY) - Abnormal; Notable for the following:    Color, Urine YELLOW (*)    APPearance CLEAR (*)    Ketones, ur TRACE (*)    All other components within normal limits  BLOOD GAS, ARTERIAL - Abnormal; Notable for the following:    pO2, Arterial 63 (*)    Allens test (pass/fail) POSITIVE (*)    All other components within normal limits  CULTURE, BLOOD (ROUTINE X 2)  CULTURE, BLOOD (ROUTINE X 2)  URINE CULTURE  LACTIC ACID, PLASMA  TROPONIN I  LACTIC ACID, PLASMA  AMMONIA  CBG MONITORING, ED   review of his laboratory workup to this point shows no significant abnormalities.  EKG:  ED ECG REPORT I, Jennye MoccasinBrian S Delesia Martinek, the attending physician, personally viewed and interpreted this ECG.  Date: 04/16/2015 EKG Time: 1704 Rate: 85 Rhythm: normal sinus rhythm QRS Axis: Low voltage QRS Intervals: normal ST/T Wave abnormalities: normal Conduction Disutrbances: none Narrative Interpretation: unremarkable No acute ischemic changes  Radiology:    EXAM: CT HEAD WITHOUT CONTRAST  TECHNIQUE: Contiguous axial images were obtained from the base of the skull through the vertex without intravenous contrast.  COMPARISON: Head CT 07/23/2014  FINDINGS: No intracranial hemorrhage, mass effect, or midline shift. Atrophy and chronic small vessel ischemic change, stable in degree  from prior. Small lacunar infarcts in the basal ganglia, right greater than left, are unchanged. No hydrocephalus. The basilar cisterns are patent. No evidence of territorial infarct. No intracranial fluid collection. Calvarium is intact. Improvement in paranasal sinus inflammatory change from prior exam with residual mucosal thickening of the maxillary sinuses. The mastoid air cells are well aerated.  IMPRESSION: Stable atrophy and chronic small vessel ischemic change without acute intracranial abnormality.   Electronically Signed By: Rubye OaksMelanie Ehinger M.D. On: 04/16/2015 20:43          DG Chest Port 1 View (Final result) Result time: 04/16/15 18:17:32   Final result by Rad Results In Interface (04/16/15 18:17:32)   Narrative:   CLINICAL DATA: Possible pneumonia  EXAM: PORTABLE CHEST 1 VIEW  COMPARISON: 07/23/2014  FINDINGS: Cardiomediastinal silhouette is stable. Thoracic aortic aneurysm again noted. No acute infiltrate or pleural effusion. No pulmonary edema.  IMPRESSION: No active disease. Again noted aneurysm of thoracic aorta.     I personally reviewed the radiologic studies     ED Course: Patient was primarily initiated on a sepsis workup Patient's stay here showed improvement of his blood pressure just with fluid resuscitation. According to the facility patient's more awake and alert and interactive which we have not currently seen here in emergency department. The patient may simply be dehydrated. According to the staff there though his mental status is not improved. I cannot find any focal neurologic signs and he appears to be afebrile at  this time.    Assessment: Altered mental status Hypotension   Final Clinical Impression:   Final diagnoses:  Stupor     Plan: *Inpatient management Patient's case was reviewed with the hospitalist team, further disposition and management depends upon her evaluation           Jennye Moccasin, MD 04/16/15 2323

## 2015-04-16 NOTE — ED Notes (Signed)
Patient comes into the ED via EMS from [facility] where he has been having generalized weakness and hypotension.  Patient apparently fell out of his bed today with no LOC, nor did he hit his head.  Patient denies any pain, N/V, or diarrhea. Patient claims he has had a cold for the past couple of weeks. Patient presents as lethargic and alert to person  And place.  BP 86/50 and CBG 127.  Patient is a DNR

## 2015-04-17 ENCOUNTER — Observation Stay: Payer: Medicare Other

## 2015-04-17 DIAGNOSIS — R5383 Other fatigue: Secondary | ICD-10-CM | POA: Diagnosis not present

## 2015-04-17 LAB — BLOOD GAS, ARTERIAL
Acid-Base Excess: 0.1 mmol/L (ref 0.0–3.0)
Allens test (pass/fail): POSITIVE — AB
BICARBONATE: 24.7 meq/L (ref 21.0–28.0)
FIO2: 0.21
O2 SAT: 92 %
PATIENT TEMPERATURE: 37
PO2 ART: 63 mmHg — AB (ref 83.0–108.0)
pCO2 arterial: 39 mmHg (ref 32.0–48.0)
pH, Arterial: 7.41 (ref 7.350–7.450)

## 2015-04-17 LAB — GLUCOSE, CAPILLARY
Glucose-Capillary: 85 mg/dL (ref 65–99)
Glucose-Capillary: 87 mg/dL (ref 65–99)
Glucose-Capillary: 106 mg/dL — ABNORMAL HIGH (ref 65–99)
Glucose-Capillary: 81 mg/dL (ref 65–99)
Glucose-Capillary: 105 mg/dL — ABNORMAL HIGH (ref 65–99)

## 2015-04-17 LAB — HEMOGLOBIN A1C: Hgb A1c MFr Bld: 5.5 % (ref 4.0–6.0)

## 2015-04-17 LAB — TSH: TSH: 2.024 u[IU]/mL (ref 0.350–4.500)

## 2015-04-17 LAB — VALPROIC ACID LEVEL: Valproic Acid Lvl: 45 ug/mL — ABNORMAL LOW (ref 50.0–100.0)

## 2015-04-17 MED ORDER — TECHNETIUM TO 99M ALBUMIN AGGREGATED
3.7960 | Freq: Once | INTRAVENOUS | Status: AC | PRN
  Administered 2015-04-17: 3.79 via INTRAVENOUS

## 2015-04-17 MED ORDER — ACETAMINOPHEN 500 MG PO TABS
1000.0000 mg | ORAL_TABLET | Freq: Three times a day (TID) | ORAL | Status: DC
  Administered 2015-04-17 – 2015-04-20 (×10): 1000 mg via ORAL
  Filled 2015-04-17 (×9): qty 2

## 2015-04-17 MED ORDER — ONDANSETRON HCL 4 MG/2ML IJ SOLN: 4.0000 mg | Freq: Four times a day (QID) | INTRAMUSCULAR | Status: DC | PRN

## 2015-04-17 MED ORDER — ONDANSETRON HCL 4 MG PO TABS: 4.0000 mg | ORAL_TABLET | Freq: Four times a day (QID) | ORAL | Status: DC | PRN

## 2015-04-17 MED ORDER — GUAIFENESIN 100 MG/5ML PO SYRP
200.0000 mg | ORAL_SOLUTION | Freq: Three times a day (TID) | ORAL | Status: DC
  Administered 2015-04-17 – 2015-04-20 (×10): 200 mg via ORAL
  Filled 2015-04-17 (×13): qty 10

## 2015-04-17 MED ORDER — METOPROLOL TARTRATE 25 MG PO TABS
25.0000 mg | ORAL_TABLET | Freq: Two times a day (BID) | ORAL | Status: DC
Start: 2015-04-17 — End: 2015-04-20
  Administered 2015-04-17 – 2015-04-20 (×4): 25 mg via ORAL
  Filled 2015-04-17 (×5): qty 1

## 2015-04-17 MED ORDER — LOPERAMIDE HCL 2 MG PO CAPS: 2.0000 mg | ORAL_CAPSULE | ORAL | Status: DC | PRN

## 2015-04-17 MED ORDER — DIVALPROEX SODIUM 125 MG PO CSDR
500.0000 mg | DELAYED_RELEASE_CAPSULE | Freq: Two times a day (BID) | ORAL | Status: DC
  Administered 2015-04-17: 500 mg via ORAL
  Filled 2015-04-17: qty 4

## 2015-04-17 MED ORDER — MELATONIN 1 MG PO TABS: 1.0000 mg | ORAL_TABLET | Freq: Every day | ORAL | Status: DC

## 2015-04-17 MED ORDER — AZITHROMYCIN 250 MG PO TABS
250.0000 mg | ORAL_TABLET | Freq: Every day | ORAL | Status: DC
  Administered 2015-04-17 – 2015-04-19 (×3): 250 mg via ORAL
  Filled 2015-04-17 (×3): qty 1

## 2015-04-17 MED ORDER — SODIUM CHLORIDE 0.9 % IV SOLN
INTRAVENOUS | Status: DC
  Administered 2015-04-17 – 2015-04-20 (×9): via INTRAVENOUS

## 2015-04-17 MED ORDER — TECHNETIUM TC 99M DIETHYLENETRIAME-PENTAACETIC ACID
43.7600 | Freq: Once | INTRAVENOUS | Status: DC | PRN
  Administered 2015-04-17: 43.76 via INTRAVENOUS
  Filled 2015-04-17: qty 43.76

## 2015-04-17 MED ORDER — HEPARIN SODIUM (PORCINE) 5000 UNIT/ML IJ SOLN
5000.0000 [IU] | Freq: Three times a day (TID) | INTRAMUSCULAR | Status: DC
  Administered 2015-04-17 – 2015-04-20 (×11): 5000 [IU] via SUBCUTANEOUS
  Filled 2015-04-17 (×11): qty 1

## 2015-04-17 MED ORDER — ACETAMINOPHEN 325 MG PO TABS
650.0000 mg | ORAL_TABLET | Freq: Four times a day (QID) | ORAL | Status: DC | PRN
  Administered 2015-04-17: 01:00:00 650 mg via ORAL
  Filled 2015-04-17 (×2): qty 2

## 2015-04-17 MED ORDER — ASPIRIN EC 81 MG PO TBEC
81.0000 mg | DELAYED_RELEASE_TABLET | Freq: Every day | ORAL | Status: DC
Start: 2015-04-17 — End: 2015-04-20
  Administered 2015-04-17 – 2015-04-20 (×4): 81 mg via ORAL
  Filled 2015-04-17 (×4): qty 1

## 2015-04-17 MED ORDER — PANTOPRAZOLE SODIUM 40 MG PO TBEC
40.0000 mg | DELAYED_RELEASE_TABLET | Freq: Every day | ORAL | Status: DC
  Administered 2015-04-17 – 2015-04-20 (×4): 40 mg via ORAL
  Filled 2015-04-17 (×4): qty 1

## 2015-04-17 MED ORDER — INSULIN ASPART 100 UNIT/ML ~~LOC~~ SOLN: 0.0000 [IU] | Freq: Three times a day (TID) | SUBCUTANEOUS | Status: DC

## 2015-04-17 MED ORDER — ACETAMINOPHEN 650 MG RE SUPP: 650.0000 mg | Freq: Four times a day (QID) | RECTAL | Status: DC | PRN

## 2015-04-17 MED ORDER — DOCUSATE SODIUM 100 MG PO CAPS
100.0000 mg | ORAL_CAPSULE | Freq: Two times a day (BID) | ORAL | Status: DC
  Administered 2015-04-17 – 2015-04-19 (×5): 100 mg via ORAL
  Filled 2015-04-17 (×5): qty 1

## 2015-04-17 MED ORDER — GABAPENTIN 100 MG PO CAPS
100.0000 mg | ORAL_CAPSULE | Freq: Three times a day (TID) | ORAL | Status: DC
  Administered 2015-04-17 – 2015-04-20 (×10): 100 mg via ORAL
  Filled 2015-04-17 (×10): qty 1

## 2015-04-17 MED ORDER — DIVALPROEX SODIUM 125 MG PO CSDR
500.0000 mg | DELAYED_RELEASE_CAPSULE | Freq: Three times a day (TID) | ORAL | Status: DC
  Administered 2015-04-17 – 2015-04-18 (×3): 500 mg via ORAL
  Filled 2015-04-17 (×3): qty 4

## 2015-04-17 MED ORDER — MEMANTINE HCL 10 MG PO TABS
10.0000 mg | ORAL_TABLET | Freq: Two times a day (BID) | ORAL | Status: DC
Start: 2015-04-17 — End: 2015-04-20
  Administered 2015-04-17 – 2015-04-20 (×7): 10 mg via ORAL
  Filled 2015-04-17 (×7): qty 1

## 2015-04-17 MED ORDER — SODIUM CHLORIDE 0.9 % IJ SOLN
3.0000 mL | Freq: Two times a day (BID) | INTRAMUSCULAR | Status: DC
Start: 2015-04-17 — End: 2015-04-20
  Administered 2015-04-17 – 2015-04-20 (×7): 3 mL via INTRAVENOUS

## 2015-04-17 NOTE — H&P (Addendum)
[name] is an 80 y.o. male.   Chief Complaint: Altered mental status HPI: The patient presents emergency department from his nursing home where he was found to be much less alert than usual. He was also relatively hypotensive response to the nursing staff to call EMS for transport. In the emergency department the patient was initially unable to provide history mostly due to somnolence/lethargy. Upon my examination of the patient he was more alert but clearly somewhat confused. He stated that he hurt all over however when asked about specific areas of pain he denied. Due to the patient's decreased from baseline relative hypotension the emergency department staff called for admission.  Past Medical History  Diagnosis Date  . COPD (chronic obstructive pulmonary disease) ([place])   . Diabetes mellitus   . Hypertension   . Aortic dissection (HCC)     type B  . Deep vein thrombosis (HCC)     history of  . Renal insufficiency   . Movement disorder   . Seizure disorder ([place])   . Essential hypertension, benign 11/11/2013  . Vascular dementia of acute onset without behavioral disturbance     Past Surgical History  Procedure Laterality Date  . Appendectomy    . Hernia repair    . Pelvic and para-aortic lymph node dissection      type B    Family History  Problem Relation Age of Onset  . Alzheimer's disease Mother   . Hypertension Mother   . Heart disease Father   . COPD Brother    Social History:  reports that he quit smoking about 34 years ago. His smoking use included Cigarettes. He has a 30 pack-year smoking history. He has never used smokeless tobacco. He reports that he does not drink alcohol or use illicit drugs.  Allergies: No Known Allergies  Medications Prior to Admission  Medication Sig Dispense Refill  . acetaminophen (TYLENOL) 500 MG tablet Take 1,000 mg by mouth 3 (three) times daily. Pt also takes every six hours PRN.    Marland Kitchen aspirin EC 81 MG tablet Take 81 mg by mouth daily.     Marland Kitchen azithromycin (ZITHROMAX) 250 MG tablet Take 250-500 mg by mouth daily. Pt takes two tablets on day 1 then one tablet for the next four days.    . divalproex (DEPAKOTE SPRINKLE) 125 MG capsule Take 500 mg by mouth 2 (two) times daily.    Marland Kitchen gabapentin (NEURONTIN) 100 MG capsule Take 100 mg by mouth 3 (three) times daily.    Marland Kitchen guaifenesin (ROBITUSSIN) 100 MG/5ML syrup Take 200 mg by mouth 3 (three) times daily.     Marland Kitchen loperamide (IMODIUM) 2 MG capsule Take 2-4 mg by mouth as needed for diarrhea or loose stools.    . Melatonin 1 MG TABS Take 1 mg by mouth at bedtime.    . memantine (NAMENDA) 10 MG tablet Take 10 mg by mouth 2 (two) times daily.    . metoprolol tartrate (LOPRESSOR) 25 MG tablet Take 25 mg by mouth 2 (two) times daily.    Marland Kitchen omeprazole (PRILOSEC) 20 MG capsule Take 20 mg by mouth daily.    . Skin Protectants, Misc. (ENDIT EX) Apply 1 application topically 2 (two) times daily as needed (for irritation).      Results for orders placed or performed during the hospital encounter of 04/16/15 (from the past 48 hour(s))  Basic metabolic panel     Status: Abnormal   Collection Time: 04/16/15  5:14 PM  Result Value Ref  Range   Sodium 133 (L) 135 - 145 mmol/L   Potassium 4.2 3.5 - 5.1 mmol/L   Chloride 100 (L) 101 - 111 mmol/L   CO2 24 22 - 32 mmol/L   Glucose, Bld 127 (H) 65 - 99 mg/dL   BUN 18 6 - 20 mg/dL   Creatinine, Ser 0.97 0.61 - 1.24 mg/dL   Calcium 8.9 8.9 - 10.3 mg/dL   GFR calc non Af Amer >60 >60 mL/min   GFR calc Af Amer >60 >60 mL/min    Comment: (NOTE) The eGFR has been calculated using the CKD EPI equation. This calculation has not been validated in all clinical situations. eGFR's persistently <60 mL/min signify possible Chronic Kidney Disease.    Anion gap 9 5 - 15  CBC     Status: Abnormal   Collection Time: 04/16/15  5:14 PM  Result Value Ref Range   WBC 8.6 3.8 - 10.6 K/uL   RBC 4.39 (L) 4.40 - 5.90 MIL/uL   Hemoglobin 13.2 13.0 - 18.0 g/dL   HCT 39.6  (L) 40.0 - 52.0 %   MCV 90.1 80.0 - 100.0 fL   MCH 30.0 26.0 - 34.0 pg   MCHC 33.3 32.0 - 36.0 g/dL   RDW 14.9 (H) 11.5 - 14.5 %   Platelets 164 150 - 440 K/uL  Troponin I     Status: None   Collection Time: 04/16/15  5:48 PM  Result Value Ref Range   Troponin I <0.03 <0.031 ng/mL    Comment:        NO INDICATION OF MYOCARDIAL INJURY.   Lactic acid, plasma     Status: None   Collection Time: 04/16/15  5:49 PM  Result Value Ref Range   Lactic Acid, Venous 1.3 0.5 - 2.0 mmol/L  Urinalysis complete, with microscopic ([facility] only)     Status: Abnormal   Collection Time: 04/16/15  7:26 PM  Result Value Ref Range   Color, Urine YELLOW (A) YELLOW   APPearance CLEAR (A) CLEAR   Glucose, UA NEGATIVE NEGATIVE mg/dL   Bilirubin Urine NEGATIVE NEGATIVE   Ketones, ur TRACE (A) NEGATIVE mg/dL   Specific Gravity, Urine 1.012 1.005 - 1.030   Hgb urine dipstick NEGATIVE NEGATIVE   pH 7.0 5.0 - 8.0   Protein, ur NEGATIVE NEGATIVE mg/dL   Nitrite NEGATIVE NEGATIVE   Leukocytes, UA NEGATIVE NEGATIVE   RBC / HPF 0-5 0 - 5 RBC/hpf   WBC, UA 0-5 0 - 5 WBC/hpf   Bacteria, UA NONE SEEN NONE SEEN   Squamous Epithelial / LPF NONE SEEN NONE SEEN   Mucous PRESENT   Blood gas, arterial ([facility] ONLY)     Status: Abnormal (Preliminary result)   Collection Time: 04/16/15  8:54 PM  Result Value Ref Range   FIO2 0.21    Delivery systems ROOM AIR    pH, Arterial 7.41 7.350 - 7.450   pCO2 arterial 39 32.0 - 48.0 mmHg   pO2, Arterial 63 (L) 83.0 - 108.0 mmHg   Bicarbonate 24.7 21.0 - 28.0 mEq/L   Acid-Base Excess 0.1 0.0 - 3.0 mmol/L   O2 Saturation 92.0 %   Patient temperature 37.0    Collection site PENDING    Sample type ARTERIAL DRAW    Allens test (pass/fail) POSITIVE (A) PASS  Ammonia     Status: None   Collection Time: 04/16/15 10:22 PM  Result Value Ref Range   Ammonia 28 9 - 35 umol/L  Glucose, capillary  Status: None   Collection Time: 04/17/15 12:56 AM  Result Value Ref Range    Glucose-Capillary 85 65 - 99 mg/dL   Ct Head Wo Contrast  04/16/2015  CLINICAL DATA:  Generalized weakness. Fall out of bed today, no loss of consciousness. Lethargy. EXAM: CT HEAD WITHOUT CONTRAST TECHNIQUE: Contiguous axial images were obtained from the base of the skull through the vertex without intravenous contrast. COMPARISON:  Head CT 07/23/2014 FINDINGS: No intracranial hemorrhage, mass effect, or midline shift. Atrophy and chronic small vessel ischemic change, stable in degree from prior. Small lacunar infarcts in the basal ganglia, right greater than left, are unchanged. No hydrocephalus. The basilar cisterns are patent. No evidence of territorial infarct. No intracranial fluid collection. Calvarium is intact. Improvement in paranasal sinus inflammatory change from prior exam with residual mucosal thickening of the maxillary sinuses. The mastoid air cells are well aerated. IMPRESSION: Stable atrophy and chronic small vessel ischemic change without acute intracranial abnormality. Electronically Signed   By: Jeb Levering M.D.   On: 04/16/2015 20:43   Dg Chest Port 1 View  04/16/2015  CLINICAL DATA:  Possible pneumonia EXAM: PORTABLE CHEST 1 VIEW COMPARISON:  07/23/2014 FINDINGS: Cardiomediastinal silhouette is stable. Thoracic aortic aneurysm again noted. No acute infiltrate or pleural effusion. No pulmonary edema. IMPRESSION: No active disease.  Again noted aneurysm of thoracic aorta. Electronically Signed   By: Lahoma Crocker M.D.   On: 04/16/2015 18:17    Review of Systems  Unable to perform ROS: dementia  Respiratory: Positive for cough. Negative for shortness of breath.   Cardiovascular: Negative for chest pain.  Gastrointestinal: Negative for nausea, vomiting and abdominal pain.  Neurological: Positive for weakness.    Blood pressure 144/76, pulse 88, temperature 98.8 F (37.1 C), temperature source Oral, resp. rate 20, height 5' 11"  (1.803 m), weight 72.802 kg (160 lb 8 oz), SpO2  100 %. Physical Exam  Constitutional: He appears well-developed and well-nourished. No distress.  HENT:  Head: Normocephalic and atraumatic.  Mouth/Throat: Oropharynx is clear and moist.  Eyes: Conjunctivae and EOM are normal. Pupils are equal, round, and reactive to light. No scleral icterus.  Neck: Normal range of motion. Neck supple. No JVD present. No tracheal deviation present. No thyromegaly present.  Cardiovascular: Normal rate, regular rhythm and normal heart sounds.  Exam reveals no gallop and no friction rub.   No murmur heard. Respiratory: Effort normal and breath sounds normal. No respiratory distress.  GI: Soft. Bowel sounds are normal. He exhibits no distension. There is no tenderness.  Genitourinary:  Deferred  Musculoskeletal: Normal range of motion. He exhibits no edema.  Lymphadenopathy:    He has no cervical adenopathy.  Neurological: He is alert. No cranial nerve deficit.  Skin: Skin is warm and dry.  Psychiatric: His behavior is normal.  Difficult to assess full mental status as the patient is somewhat lethargic and has baseline dementia     Assessment/Plan This 80 year old Caucasian male admitted for altered mental status, hypotension and hyponatremia.   1. Altered mental status: Lethargy; the patient's level of alertness waxes and wanes. Frontal diagnosis includes stepwise decrease in function secondary to dementia, dehydration, electrolyte abnormalities and metabolic derangement.  2. Hyponatremia: Likely secondary to hypovolemia. Possibly contributes to altered mental status. Rehydrate with normal saline. Encourage by mouth intake. 3. Hypotension: Relative; hydration should improve oriented status. We'll check orthostatics when fully fluid resuscitated. 4. Dementia: Continue Namenda 5. Hypertension: Continue metoprolol for heart rate 6. Diabetes mellitus type II: Check hemoglobin A1c.  Sliding scale insulin while patient is hospitalized 7. Seizure disorder:  Remote history; continue Depakote (likely as much for behavioral control as seizure prevention) 8. Cough: The patient recently began a Z-Pak presumably for his cough. No evidence of pneumonia at this time but we will complete the antibiotic course. No leukocytosis or signs or symptoms of sepsis. 9. DVT prophylaxis: Heparin 10. GI prophylaxis: Pantoprazole The patient is a DO NOT RESUSCITATE. Time spent on admission orders and patient care possibly 35 minutes.  Harrie Foreman 04/17/2015, 1:11 AM

## 2015-04-17 NOTE — Plan of Care (Addendum)
Problem: Discharge Progression Outcomes Goal: Discharge plan in place and appropriate Outcome: [facility]ing [facility] family--facility told ED unable to reach nephew. Pt states nephew's name is Molli HazardMatthew Sosa Hx Dementia, COPD, seizures, DM, continue on home medications DNR, High Fall Risk Goal: Other Discharge Outcomes/Goals Outcome: Progressing Pt is alert to self and place. C/o generalized pain, tylenol given with relief. Unable to answer admission questions at this time, patient answers "No" to every question. Receiving NS at 125 ml/hr. Pt refuses take off brace on R wrist. VSS, continue to assess.

## 2015-04-17 NOTE — Progress Notes (Signed)
PHARMACIST - PHYSICIAN ORDER COMMUNICATION  CONCERNING: P&T Medication Policy on Herbal Medications  DESCRIPTION:  This patient's order for:  melatonin  has been noted.  This product(s) is classified as an "herbal" or natural product. Due to a lack of definitive safety studies or FDA approval, nonstandard manufacturing practices, plus the potential risk of unknown drug-drug interactions while on inpatient medications, the Pharmacy and Therapeutics Committee does not permit the use of "herbal" or natural products of this type within [facility].   ACTION TAKEN: The pharmacy department is unable to verify this order at this time. Please reevaluate patient's clinical condition at discharge and address if the herbal or natural product(s) should be resumed at that time.  2

## 2015-04-17 NOTE — Progress Notes (Signed)
[facility] - [place] at [facility]   PATIENT NAME: [name]    MR#:  [id number]  DATE OF BIRTH:  18-Jun-1935  SUBJECTIVE:  CHIEF COMPLAINT:   Chief Complaint  Patient presents with  . Hypotension  . Weakness   patient is 80 year old Caucasian male with past medical history significant for history of COPD, diabetes, hypertension, seizure disorder who presents to the hospital with somnolence and hypotension. He was found to be confused. In emergency room. He denies any chest pains, abdominal pain, but remains somnolent and confused and unable to review systems  Review of Systems  Unable to perform ROS: mental acuity    VITAL SIGNS: Blood pressure 128/75, pulse 61, temperature 98 F (36.7 C), temperature source Oral, resp. rate 18, height  (1.803 m), weight 73.619 kg (162 lb 4.8 oz), SpO2 95 %.  PHYSICAL EXAMINATION:   GENERAL:  80 y.o.-year-old patient lying in the bed with no acute distress. Somnolent, arousable, able to open his eyes and converse briefly but then drifts back to sleep EYES: Pupils equal, round, reactive to light and accommodation. No scleral icterus. Extraocular muscles intact.  HEENT: Head atraumatic, normocephalic. Oropharynx and nasopharynx clear.  NECK:  Supple, no jugular venous distention. No thyroid enlargement, no tenderness.  LUNGS: Normal breath sounds bilaterally, no wheezing, rales,rhonchi or crepitation. No use of accessory muscles of respiration.  CARDIOVASCULAR: S1, S2 normal. No murmurs, rubs, or gallops.  ABDOMEN: Soft, nontender, nondistended. Bowel sounds present. No organomegaly or mass.  EXTREMITIES: No pedal edema, cyanosis, or clubbing.  NEUROLOGIC: Cranial nerves II through XII are intact. Muscle strength 5/5 in all extremities. Sensation intact. Gait not checked.  PSYCHIATRIC: The patient is alert and oriented x 3.  SKIN: No obvious rash, lesion, or ulcer.   ORDERS/RESULTS REVIEWED:   CBC  Recent  Labs Lab 04/16/15 1714  WBC 8.6  HGB 13.2  HCT 39.6*  PLT 164  MCV 90.1  MCH 30.0  MCHC 33.3  RDW 14.9*   ------------------------------------------------------------------------------------------------------------------  Chemistries   Recent Labs Lab 04/16/15 1714  NA 133*  K 4.2  CL 100*  CO2 24  GLUCOSE 127*  BUN 18  CREATININE 0.97  CALCIUM 8.9   ------------------------------------------------------------------------------------------------------------------ estimated creatinine clearance is 66.4 mL/min (by C-G formula based on Cr of 0.97). ------------------------------------------------------------------------------------------------------------------  Recent Labs  04/17/15 0155  TSH 2.024    Cardiac Enzymes  Recent Labs Lab 04/16/15 1748  TROPONINI <0.03   ------------------------------------------------------------------------------------------------------------------ Invalid input(s): POCBNP ---------------------------------------------------------------------------------------------------------------  RADIOLOGY: Ct Head Wo Contrast  04/16/2015  CLINICAL DATA:  Generalized weakness. Fall out of bed today, no loss of consciousness. Lethargy. EXAM: CT HEAD WITHOUT CONTRAST TECHNIQUE: Contiguous axial images were obtained from the base of the skull through the vertex without intravenous contrast. COMPARISON:  Head CT 07/23/2014 FINDINGS: No intracranial hemorrhage, mass effect, or midline shift. Atrophy and chronic small vessel ischemic change, stable in degree from prior. Small lacunar infarcts in the basal ganglia, right greater than left, are unchanged. No hydrocephalus. The basilar cisterns are patent. No evidence of territorial infarct. No intracranial fluid collection. Calvarium is intact. Improvement in paranasal sinus inflammatory change from prior exam with residual mucosal thickening of the maxillary sinuses. The mastoid air cells are well aerated.  IMPRESSION: Stable atrophy and chronic small vessel ischemic change without acute intracranial abnormality. Electronically Signed   By: Rubye Oaks M.D.   On: 04/16/2015 20:43   Dg Chest Port 1 View  04/16/2015  CLINICAL DATA:  Possible pneumonia EXAM:  PORTABLE CHEST 1 VIEW COMPARISON:  07/23/2014 FINDINGS: Cardiomediastinal silhouette is stable. Thoracic aortic aneurysm again noted. No acute infiltrate or pleural effusion. No pulmonary edema. IMPRESSION: No active disease.  Again noted aneurysm of thoracic aorta. Electronically Signed   By: Natasha MeadLiviu  Pop M.D.   On: 04/16/2015 18:17    EKG:  Orders placed or performed during the hospital encounter of 04/16/15  . ED EKG  . ED EKG    ASSESSMENT AND PLAN:  Active Problems:   Lethargy 1. Altered mental status of unclear etiology, concern of postictal state. Get prolactin level stat and neurology consultation. No obvious infection noted. CT of head without contrast was unremarkable. Valproic acid level is low at 45 advance it.  2. Hypoxia, possibly related to altered mental status. Continue oxygen therapy as needed. Get V/Q 3. Hyponatremia,  likely dehydration related. Continue IV fluids, follow sodium level in the morning 4. Hyperglycemia, likely stress. Hemoglobin A1c is 5.5  Management plans discussed with pharmacy   DRUG ALLERGIES: No Known Allergies  CODE STATUS:     Code Status Orders        Start     Ordered   04/17/15 0035  Do not attempt resuscitation (DNR)   Continuous    Question Answer Comment  In the event of cardiac or respiratory ARREST Do not call a "code blue"   In the event of cardiac or respiratory ARREST Do not perform Intubation, CPR, defibrillation or ACLS   In the event of cardiac or respiratory ARREST Use medication by any route, position, wound care, and other measures to relive pain and suffering. May use oxygen, suction and manual treatment of airway obstruction as needed for comfort.      04/17/15  0034      TOTAL TIME TAKING CARE OF THIS PATIENT: 40 minutes.    Katharina CaperVAICKUTE,Jimi Schappert M.D on 04/17/2015 at 11:03 AM  Between 7am to 6pm - Pager - [phone]  After 6pm go to [email or web address] - password [email or web address] [facility]  [facility]  Office  [phone]  CC: Primary care physician; Tillman Abideichard Letvak, MD

## 2015-04-17 NOTE — Plan of Care (Signed)
Problem: Discharge Progression Outcomes Goal: Other Discharge Outcomes/Goals Outcome: Progressing Patient has no complaints of pain. VSS. IV fluids infusing. Tolerating diet well. Remains confused to time and situation. Neuro checks performed.

## 2015-04-18 DIAGNOSIS — L8990 Pressure ulcer of unspecified site, unspecified stage: Secondary | ICD-10-CM | POA: Insufficient documentation

## 2015-04-18 DIAGNOSIS — R5383 Other fatigue: Secondary | ICD-10-CM

## 2015-04-18 LAB — BASIC METABOLIC PANEL
Anion gap: 5 (ref 5–15)
BUN: 9 mg/dL (ref 6–20)
CO2: 28 mmol/L (ref 22–32)
CREATININE: 0.76 mg/dL (ref 0.61–1.24)
Calcium: 8.7 mg/dL — ABNORMAL LOW (ref 8.9–10.3)
Chloride: 108 mmol/L (ref 101–111)
GFR calc Af Amer: >60 mL/min (ref >60)
GLUCOSE: 79 mg/dL (ref 65–99)
Potassium: 4 mmol/L (ref 3.5–5.1)
SODIUM: 141 mmol/L (ref 135–145)

## 2015-04-18 LAB — GLUCOSE, CAPILLARY
Glucose-Capillary: 76 mg/dL (ref 65–99)
Glucose-Capillary: 96 mg/dL (ref 65–99)
Glucose-Capillary: 84 mg/dL (ref 65–99)
Glucose-Capillary: 157 mg/dL — ABNORMAL HIGH (ref 65–99)

## 2015-04-18 LAB — URINE CULTURE: CULTURE: NO GROWTH

## 2015-04-18 LAB — PROLACTIN: Prolactin: 8.8 ng/mL (ref 4.0–15.2)

## 2015-04-18 LAB — VALPROIC ACID LEVEL: VALPROIC ACID LVL: 63 ug/mL (ref 50.0–100.0)

## 2015-04-18 MED ORDER — DIVALPROEX SODIUM 125 MG PO CSDR
500.0000 mg | DELAYED_RELEASE_CAPSULE | Freq: Two times a day (BID) | ORAL | Status: DC
  Administered 2015-04-18 – 2015-04-20 (×4): 500 mg via ORAL
  Filled 2015-04-18 (×4): qty 4

## 2015-04-18 MED ORDER — IPRATROPIUM-ALBUTEROL 0.5-2.5 (3) MG/3ML IN SOLN
3.0000 mL | RESPIRATORY_TRACT | Status: DC
  Administered 2015-04-18 – 2015-04-19 (×6): 3 mL via RESPIRATORY_TRACT
  Filled 2015-04-18 (×6): qty 3

## 2015-04-18 NOTE — Plan of Care (Signed)
Problem: Discharge Progression Outcomes Goal: Other Discharge Outcomes/Goals Outcome: Progressing Plan of Care Progress to Goal:   Pt denies pain. Pt can be bradycardic at times. Pt still has non productive cough. Pt has been resting comfortably during shift. No other signs of distress noted. Will continue to monitor.

## 2015-04-18 NOTE — Consult Note (Signed)
CC: AMS    HPI: [name] is an 80 y.o. male  with past medical history significant for history of COPD, diabetes, hypertension, seizure disorder who presents to the hospital with somnolence and hypotension. He was found to be confused.  Mental status improved. Last seizure was about 10 yrs ago.    Past Medical History  Diagnosis Date  . COPD (chronic obstructive pulmonary disease) (HCC)   . Diabetes mellitus   . Hypertension   . Aortic dissection (HCC)     type B  . Deep vein thrombosis (HCC)     history of  . Renal insufficiency   . Movement disorder   . Seizure disorder (HCC)   . Essential hypertension, benign 11/11/2013  . Vascular dementia of acute onset without behavioral disturbance     Past Surgical History  Procedure Laterality Date  . Appendectomy    . Hernia repair    . Pelvic and para-aortic lymph node dissection      type B    Family History  Problem Relation Age of Onset  . Alzheimer's disease Mother   . Hypertension Mother   . Heart disease Father   . COPD Brother     Social History:  reports that he quit smoking about 34 years ago. His smoking use included Cigarettes. He has a 30 pack-year smoking history. He has never used smokeless tobacco. He reports that he does not drink alcohol or use illicit drugs.  No Known Allergies  Medications: I have reviewed the patient's current medications.  ROS: Unable to obtain due to confusion.    Physical Examination: Blood pressure 99/57, pulse 82, temperature 98.3 F (36.8 C), temperature source Oral, resp. rate 20, height  (1.803 m), weight 165 lb 4.8 oz (74.98 kg), SpO2 96 %.   Neurological Examination Mental Status: Alert to name Cranial Nerves: II: Discs flat bilaterally; Visual fields grossly normal, pupils equal, round, reactive to light and accommodation III,IV, VI: ptosis not present, extra-ocular motions intact bilaterally V,VII: smile symmetric, facial light touch sensation normal  bilaterally VIII: hearing normal bilaterally IX,X: gag reflex present XI: bilateral shoulder shrug XII: midline tongue extension Motor: Right : Upper extremity   4+/5    Left:     Upper extremity   4+/5  Lower extremity   4+/5     Lower extremity   4+/5 Tone and bulk:normal tone throughout; no atrophy noted Sensory: Pinprick and light touch intact throughout, bilaterally Deep Tendon Reflexes: 1+ and symmetric throughout Plantars: Right: downgoing   Left: downgoing Cerebellar: normal finger-to-nose, normal rapid alternating movements and normal heel-to-shin test Gait: not tested     Laboratory Studies:   Basic Metabolic Panel:  Recent Labs Lab 04/16/15 1714 04/18/15 0440  NA 133* 141  K 4.2 4.0  CL 100* 108  CO2 24 28  GLUCOSE 127* 79  BUN 18 9  CREATININE 0.97 0.76  CALCIUM 8.9 8.7*    Liver Function Tests: No results for input(s): AST, ALT, ALKPHOS, BILITOT, PROT, ALBUMIN in the last 168 hours. No results for input(s): LIPASE, AMYLASE in the last 168 hours.  Recent Labs Lab 04/16/15 2222  AMMONIA 28    CBC:  Recent Labs Lab 04/16/15 1714  WBC 8.6  HGB 13.2  HCT 39.6*  MCV 90.1  PLT 164    Cardiac Enzymes:  Recent Labs Lab 04/16/15 1748  TROPONINI <0.03    BNP: Invalid input(s): POCBNP  CBG:  Recent Labs Lab 04/17/15 1137 04/17/15 1642 04/17/15 2047  04/18/15 0743 04/18/15 1216  GLUCAP 106* 81 105* 76 96    Microbiology: Results for orders placed or performed during the hospital encounter of 04/16/15  Culture, blood (routine x 2)     Status: None (Preliminary result)   Collection Time: 04/16/15  5:14 PM  Result Value Ref Range Status   Specimen Description BLOOD  Final   Special Requests NONE  Final   Culture NO GROWTH < 24 HOURS  Final   Report Status PENDING  Incomplete  Culture, blood (routine x 2)     Status: None (Preliminary result)   Collection Time: 04/16/15  5:49 PM  Result Value Ref Range Status   Specimen  Description BLOOD  Final   Special Requests NONE  Final   Culture NO GROWTH < 24 HOURS  Final   Report Status PENDING  Incomplete  Urine culture     Status: None   Collection Time: 04/16/15  7:26 PM  Result Value Ref Range Status   Specimen Description URINE, CLEAN CATCH  Final   Special Requests NONE  Final   Culture NO GROWTH 2 DAYS  Final   Report Status 04/18/2015 FINAL  Final    Coagulation Studies: No results for input(s): LABPROT, INR in the last 72 hours.  Urinalysis:  Recent Labs Lab 04/16/15 1926  COLORURINE YELLOW*  LABSPEC 1.012  PHURINE 7.0  GLUCOSEU NEGATIVE  HGBUR NEGATIVE  BILIRUBINUR NEGATIVE  KETONESUR TRACE*  PROTEINUR NEGATIVE  NITRITE NEGATIVE  LEUKOCYTESUR NEGATIVE    Lipid Panel:     Component Value Date/Time   CHOL 147 12/24/2013 0155   CHOL 168 04/24/2013 0951   TRIG 108 12/24/2013 0155   TRIG 178.0* 04/24/2013 0951   HDL 35* 12/24/2013 0155   HDL 37.20* 04/24/2013 0951   CHOLHDL 5 04/24/2013 0951   VLDL 22 12/24/2013 0155   VLDL 35.6 04/24/2013 0951   LDLCALC 90 12/24/2013 0155   LDLCALC 95 04/24/2013 0951    HgbA1C:  Lab Results  Component Value Date   HGBA1C 5.5 04/16/2015    Urine Drug Screen:     Component Value Date/Time   LABOPIA NONE DETECTED 02/18/2009 2248   COCAINSCRNUR NONE DETECTED 02/18/2009 2248   LABBENZ NONE DETECTED 02/18/2009 2248   AMPHETMU NONE DETECTED 02/18/2009 2248   THCU NONE DETECTED 02/18/2009 2248   LABBARB  02/18/2009 2248    NONE DETECTED        DRUG SCREEN FOR MEDICAL PURPOSES ONLY.  IF CONFIRMATION IS NEEDED FOR ANY PURPOSE, NOTIFY LAB WITHIN 5 DAYS.        LOWEST DETECTABLE LIMITS FOR URINE DRUG SCREEN Drug Class       Cutoff (ng/mL) Amphetamine      1000 Barbiturate      200 Benzodiazepine   200 Tricyclics       300 Opiates          300 Cocaine          300 THC              50    Alcohol Level: No results for input(s): ETH in the last 168 hours.  Other results: EKG: normal  EKG, normal sinus rhythm, unchanged from previous tracings.  Imaging: Ct Head Wo Contrast  04/16/2015  CLINICAL DATA:  Generalized weakness. Fall out of bed today, no loss of consciousness. Lethargy. EXAM: CT HEAD WITHOUT CONTRAST TECHNIQUE: Contiguous axial images were obtained from the base of the skull through the vertex without intravenous contrast. COMPARISON:  Head CT 07/23/2014 FINDINGS: No intracranial hemorrhage, mass effect, or midline shift. Atrophy and chronic small vessel ischemic change, stable in degree from prior. Small lacunar infarcts in the basal ganglia, right greater than left, are unchanged. No hydrocephalus. The basilar cisterns are patent. No evidence of territorial infarct. No intracranial fluid collection. Calvarium is intact. Improvement in paranasal sinus inflammatory change from prior exam with residual mucosal thickening of the maxillary sinuses. The mastoid air cells are well aerated. IMPRESSION: Stable atrophy and chronic small vessel ischemic change without acute intracranial abnormality. Electronically Signed   By: Rubye OaksMelanie  Ehinger M.D.   On: 04/16/2015 20:43   Nm Pulmonary Perf And Vent  04/17/2015  CLINICAL DATA:  Hypoxemia.  COPD.  Diabetes.  Renal insufficiency. EXAM: NUCLEAR MEDICINE VENTILATION - PERFUSION LUNG SCAN TECHNIQUE: Ventilation images were obtained in multiple projections using inhaled aerosol Tc-2240m DTPA. Perfusion images were obtained in multiple projections after intravenous injection of Tc-2240m MAA. RADIOPHARMACEUTICALS:  43.7 Technetium-240m DTPA aerosol inhalation and 3.8 Technetium-6840m MAA IV COMPARISON:  Chest radiograph of 04/16/2015. FINDINGS: Ventilation: Heterogeneous ventilation, without focal well-defined ventilation defect. This may relate to air trapping. Perfusion: No wedge shaped peripheral perfusion defects to suggest acute pulmonary embolism. Degraded exam, as patient was not able to lay on the scanning table. Therefore, only frontal views  were performed. IMPRESSION: No evidence of pulmonary embolism.  Limitations as detailed above. Electronically Signed   By: Jeronimo GreavesKyle  Talbot M.D.   On: 04/17/2015 15:54   Dg Chest Port 1 View  04/16/2015  CLINICAL DATA:  Possible pneumonia EXAM: PORTABLE CHEST 1 VIEW COMPARISON:  07/23/2014 FINDINGS: Cardiomediastinal silhouette is stable. Thoracic aortic aneurysm again noted. No acute infiltrate or pleural effusion. No pulmonary edema. IMPRESSION: No active disease.  Again noted aneurysm of thoracic aorta. Electronically Signed   By: Natasha MeadLiviu  Pop M.D.   On: 04/16/2015 18:17     Assessment/Plan:  80 y.o. male  with past medical history significant for history of COPD, diabetes, hypertension, seizure disorder who presents to the hospital with somnolence and hypotension. He was found to be confused.  Mental status improved. Last seizure was about 10 yrs ago  Not convinced true seizure activity as mental status is improving. Agree with metabolic encephalopathy VPA at 500 BID, slightly decreased level, would not change or load as not convinced true seizure No further imaging from neuro stand point.   04/18/2015, 1:30 PM

## 2015-04-18 NOTE — Progress Notes (Signed)
[facility] [place] [facility]   PATIENT NAME: Brian HumanJohn Sosa    MR#:  [id number]  DATE OF BIRTH:  10-31-1934  SUBJECTIVE:  CHIEF COMPLAINT:   Chief Complaint  Patient presents with  . Hypotension  . Weakness   patient is 80 year old Caucasian male with past medical history significant for history of COPD, diabetes, hypertension, seizure disorder who presents to the hospital with somnolence and hypotension. He was found to be confused. In emergency room. He denies any chest pains, abdominal pain, but remains somnolent and confused and unable to review systems. Patient was noted to have cough and some rattling in the chest and he was initiated on Zithromax, chest x-ray was unremarkable as well as VQ scan.  Patient feels satisfactory today, remains somewhat somnolent early in the morning, no confusion, able to answer questions appropriately  Review of Systems  Unable to perform ROS: mental acuity    VITAL SIGNS: Blood pressure 144/71, pulse 70, temperature 98.3 F (36.8 C), temperature source Oral, resp. rate 20, height 5\' 11"  (1.803 m), weight 74.98 kg (165 lb 4.8 oz), SpO2 99 %.  PHYSICAL EXAMINATION:   GENERAL:  80 y.o.-year-old patient lying in the bed with no acute distress. Somnolent, arousable, able to open his eyes and converse briefly but then drifts back to sleep, no confusion was noted. Patient is appropriate answering questions EYES: Pupils equal, round, reactive to light and accommodation. No scleral icterus. Extraocular muscles intact.  HEENT: Head atraumatic, normocephalic. Oropharynx and nasopharynx clear.  NECK:  Supple, no jugular venous distention. No thyroid enlargement, no tenderness.  LUNGS: Normal breath sounds bilaterally, no wheezing, rales,rhonchi or crepitation. No use of accessory muscles of respiration.  CARDIOVASCULAR: S1, S2 normal. No murmurs, rubs, or gallops.  ABDOMEN: Soft, nontender, nondistended. Bowel sounds present. No  organomegaly or mass.  EXTREMITIES: No pedal edema, cyanosis, or clubbing.  NEUROLOGIC: Cranial nerves II through XII are intact. Muscle strength 5/5 in all extremities. Sensation intact. Gait not checked.  PSYCHIATRIC: The patient is alert and oriented x 3.  SKIN: No obvious rash, lesion, or ulcer.   ORDERS/RESULTS REVIEWED:   CBC  Recent Labs Lab 04/16/15 1714  WBC 8.6  HGB 13.2  HCT 39.6*  PLT 164  MCV 90.1  MCH 30.0  MCHC 33.3  RDW 14.9*   ------------------------------------------------------------------------------------------------------------------  Chemistries   Recent Labs Lab 04/16/15 1714 04/18/15 0440  NA 133* 141  K 4.2 4.0  CL 100* 108  CO2 24 28  GLUCOSE 127* 79  BUN 18 9  CREATININE 0.97 0.76  CALCIUM 8.9 8.7*   ------------------------------------------------------------------------------------------------------------------ estimated creatinine clearance is 82 mL/min (by C-G formula based on Cr of 0.76). ------------------------------------------------------------------------------------------------------------------  Recent Labs  04/17/15 0155  TSH 2.024    Cardiac Enzymes  Recent Labs Lab 04/16/15 1748  TROPONINI <0.03   ------------------------------------------------------------------------------------------------------------------ Invalid input(s): POCBNP ---------------------------------------------------------------------------------------------------------------  RADIOLOGY: Ct Head Wo Contrast  04/16/2015  CLINICAL DATA:  Generalized weakness. Fall out of bed today, no loss of consciousness. Lethargy. EXAM: CT HEAD WITHOUT CONTRAST TECHNIQUE: Contiguous axial images were obtained from the base of the skull through the vertex without intravenous contrast. COMPARISON:  Head CT 07/23/2014 FINDINGS: No intracranial hemorrhage, mass effect, or midline shift. Atrophy and chronic small vessel ischemic change, stable in degree from  prior. Small lacunar infarcts in the basal ganglia, right greater than left, are unchanged. No hydrocephalus. The basilar cisterns are patent. No evidence of territorial infarct. No intracranial fluid collection. Calvarium is intact. Improvement in  paranasal sinus inflammatory change from prior exam with residual mucosal thickening of the maxillary sinuses. The mastoid air cells are well aerated. IMPRESSION: Stable atrophy and chronic small vessel ischemic change without acute intracranial abnormality. Electronically Signed   By: Rubye Oaks M.D.   On: 04/16/2015 20:43   Nm Pulmonary Perf And Vent  04/17/2015  CLINICAL DATA:  Hypoxemia.  COPD.  Diabetes.  Renal insufficiency. EXAM: NUCLEAR MEDICINE VENTILATION - PERFUSION LUNG SCAN TECHNIQUE: Ventilation images were obtained in multiple projections using inhaled aerosol Tc-79m DTPA. Perfusion images were obtained in multiple projections after intravenous injection of Tc-20m MAA. RADIOPHARMACEUTICALS:  43.7 Technetium-30m DTPA aerosol inhalation and 3.8 Technetium-35m MAA IV COMPARISON:  Chest radiograph of 04/16/2015. FINDINGS: Ventilation: Heterogeneous ventilation, without focal well-defined ventilation defect. This may relate to air trapping. Perfusion: No wedge shaped peripheral perfusion defects to suggest acute pulmonary embolism. Degraded exam, as patient was not able to lay on the scanning table. Therefore, only frontal views were performed. IMPRESSION: No evidence of pulmonary embolism.  Limitations as detailed above. Electronically Signed   By: Jeronimo Greaves M.D.   On: 04/17/2015 15:54   Dg Chest Port 1 View  04/16/2015  CLINICAL DATA:  Possible pneumonia EXAM: PORTABLE CHEST 1 VIEW COMPARISON:  07/23/2014 FINDINGS: Cardiomediastinal silhouette is stable. Thoracic aortic aneurysm again noted. No acute infiltrate or pleural effusion. No pulmonary edema. IMPRESSION: No active disease.  Again noted aneurysm of thoracic aorta. Electronically Signed    By: Natasha Mead M.D.   On: 04/16/2015 18:17    EKG:  Orders placed or performed during the hospital encounter of 04/16/15  . ED EKG  . ED EKG    ASSESSMENT AND PLAN:  Active Problems:   Lethargy 1. Altered mental status of unclear etiology, possibly metabolic encephalopathy due to acute bronchitis , unlikely postictal state as patient's prolactin level was normal, although neurology consultation is pending . CT of head without contrast was unremarkable. Valproic acid level was low at 45, now therapeutic.  2. Hypoxia, likely related to altered mental status. Continue oxygen therapy as needed. Unremarkable V/Q 3. Hyponatremia,  likely dehydration related. Continue IV fluids, sodium level normalized today 4. Hyperglycemia, likely stress. Hemoglobin A1c is 5.5 5. Acute bronchitis. Continue Zithromax and get sputum cultures added duo nebs  Management plans discussed with pharmacy   DRUG ALLERGIES: No Known Allergies  CODE STATUS:     Code Status Orders        Start     Ordered   04/17/15 0035  Do not attempt resuscitation (DNR)   Continuous    Question Answer Comment  In the event of cardiac or respiratory ARREST Do not call a "code blue"   In the event of cardiac or respiratory ARREST Do not perform Intubation, CPR, defibrillation or ACLS   In the event of cardiac or respiratory ARREST Use medication by any route, position, wound care, and other measures to relive pain and suffering. May use oxygen, suction and manual treatment of airway obstruction as needed for comfort.      04/17/15 0034      TOTAL TIME TAKING CARE OF THIS PATIENT: 35 minutes.    Katharina Caper M.D on 04/18/2015 at 8:24 AM  Between 7am to 6pm - Pager - [phone]  After 6pm go to [email or web address] - password [email or web address] [facility]  [facility] [place] [facility]  Office  [phone]  CC: Primary care physician; No primary care provider on file.

## 2015-04-18 NOTE — Plan of Care (Signed)
Problem: Discharge Progression Outcomes Goal: Other Discharge Outcomes/Goals Outcome: Not Progressing Pt very lethargic today.  Only oriented to self and couldn't remember his birthday.  Very poor PO intake, but will take his meds with applesauce but will not eat anymore.  IV fluids still running at 125.  BP has been low today - held morning's metoprolol.  Has Stage 1 pressure ulcer on sacrum - allevyn dressing applied.  Had neuro consult today.  Breathing tx started - pt has diminished ronchi breath sounds.  Has been SR on monitor - hasn't been bradycardic today.  Would ask dr about palliative consult.  Had a call from Burnis Kingfisheriffany Shelton, Charity fundraiserN at [facility].  If d/c plan is to [facility], she or another nurse will have to come reassess pt.

## 2015-04-19 DIAGNOSIS — R5383 Other fatigue: Secondary | ICD-10-CM | POA: Diagnosis not present

## 2015-04-19 LAB — GLUCOSE, CAPILLARY
Glucose-Capillary: 96 mg/dL (ref 65–99)
Glucose-Capillary: 88 mg/dL (ref 65–99)
Glucose-Capillary: 120 mg/dL — ABNORMAL HIGH (ref 65–99)
Glucose-Capillary: 111 mg/dL — ABNORMAL HIGH (ref 65–99)
Glucose-Capillary: 106 mg/dL — ABNORMAL HIGH (ref 65–99)

## 2015-04-19 LAB — C DIFFICILE QUICK SCREEN W PCR REFLEX
C DIFFICLE (CDIFF) ANTIGEN: NEGATIVE
C Diff interpretation: NEGATIVE
C Diff toxin: NEGATIVE

## 2015-04-19 MED ORDER — IPRATROPIUM-ALBUTEROL 0.5-2.5 (3) MG/3ML IN SOLN
3.0000 mL | RESPIRATORY_TRACT | Status: DC | PRN
  Administered 2015-04-20: 05:00:00 3 mL via RESPIRATORY_TRACT
  Filled 2015-04-19: qty 3

## 2015-04-19 NOTE — Clinical Social Work Note (Signed)
Clinical Social Work Assessment  Patient Details  Name: [name] MRN: [id number] Date of Birth: July 03, 1934  Date of referral:  04/19/15               Reason for consult:   (From [facility]/ [name])                Permission sought to share information with:  Facility Medical sales representativeContact Representative, Family Supports Permission granted to share information::     Name::        Agency::     Relationship::     Contact Information:     Housing/Transportation Living arrangements for the past 2 months:  [facility] ([facility]) Source of Information:  Facility, Power of Attorney Patient Interpreter Needed:  None Criminal Activity/Legal Involvement Pertinent to Current Situation/Hospitalization:  No - Comment as needed Significant Relationships:  Other Family Members (nephew and nephew's wife are only living relatives) Lives with:    Do you feel safe going back to the place where you live?  Yes (nephew feels that it is safe for patient to return to ALF) Need for family participation in patient care:  Yes (Comment) (patient has dementia)  Care giving concerns:  Facility concern that they are unable to meet patient's needs.   Social Worker assessment / plan: Patient is very pleasant and alert to self only. He was sitting up in bed eating dinned.  Informed CSW he is enjoying the food.  States he lives alone.   [facility], [phone]. Has lived there since May 2015. [facility] patient lived at home alone. Patient never married and had no children.   CSW called [name] spoke to Foot Lockeriffany RN  [phone] (cell ) she will come to evaluate patient in the AM.  States she is concerned that the Memory Care Unit is unable to meet patient's needs.    Call to patient's nephew [name]  [phone], POA and only living relative.    Per [name] patient does not walk and goes from wheel chair to chair.  CSW Informed [name], that  [name] will come to evaluate patient tomorrow to determine if he is able to return. Nephew was surprised as this was the first he had heard that patient may not be able to return.  CSW will complete FL2 and place on chart in anticipation of patient returning to ALF tomorrow. [facility] around 9:00 AM tomorrow.    Employment status:  Retired Health and safety inspectornsurance information:  Armed forces operational officerMedicare, Medicaid [place]:    Information / Referral to community resources:   (none at this time)  Patient/Family's Response to care:  Nephew was appreciative of talking with CSW.  Patient/Family's Understanding of and Emotional Response to Diagnosis, Current Treatment, and Prognosis:  Nephew understands patient anticipated discharge for tomorrow if medically stable.   Emotional Assessment Appearance:  Appears older than stated age Attitude/Demeanor/Rapport:    Affect (typically observed):  Pleasant Orientation:  Oriented to Self Alcohol / Substance use:    Psych involvement (Current and /or in the community):  No (Comment)  Discharge Needs  Concerns to be addressed:  Care Coordination Readmission within the last 30 days:  No Current discharge risk:  Chronically ill, Cognitively Impaired, Dependent with Mobility Barriers to Discharge:   (ALF to evaluate patient prior to returning to facility)   Soundra PilonMoore, [name], LCSW 04/19/2015, 5:52 PM

## 2015-04-19 NOTE — Progress Notes (Signed)
[facility] [place] at [facility]   PATIENT NAME: [name]    MR#:  [id number]  DATE OF BIRTH:  09/04/1934  SUBJECTIVE:  CHIEF COMPLAINT:   Chief Complaint  Patient presents with  . Hypotension  . Weakness   Patient presented to the hospital due to altered mental status and hypotension. Mental status is improved today. No longer hypotensive. Tolerating by mouth well. No acute events or seizures overnight.  REVIEW OF SYSTEMS:    Review of Systems  Unable to perform ROS: dementia    Nutrition: Regular Tolerating Diet: Yes Tolerating PT: Await evaluation   DRUG ALLERGIES:  No Known Allergies  VITALS:  Blood pressure 134/81, pulse 80, temperature 98.3 F (36.8 C), temperature source Oral, resp. rate 18, height 5\' 11"  (1.803 m), weight 75.524 kg (166 lb 8 oz), SpO2 99 %.  PHYSICAL EXAMINATION:   Physical Exam  GENERAL:  80 y.o.-year-old patient lying in the bed with no acute distress.  EYES: Pupils equal, round, reactive to light and accommodation. No scleral icterus. Extraocular muscles intact.  HEENT: Head atraumatic, normocephalic. Oropharynx and nasopharynx clear.  NECK:  Supple, no jugular venous distention. No thyroid enlargement, no tenderness.  LUNGS: Normal breath sounds bilaterally, no wheezing, rales, rhonchi. No use of accessory muscles of respiration.  CARDIOVASCULAR: S1, S2 normal. No murmurs, rubs, or gallops.  ABDOMEN: Soft, nontender, nondistended. Bowel sounds present. No organomegaly or mass.  EXTREMITIES: No cyanosis, clubbing or edema b/l.    NEUROLOGIC: Cranial nerves II through XII are intact. No focal Motor or sensory deficits b/l. Globally weak   PSYCHIATRIC: The patient is alert and oriented x 1. Good affect SKIN: No obvious rash, lesion, or ulcer.    LABORATORY PANEL:   CBC  Recent Labs Lab 04/16/15 1714  WBC 8.6  HGB 13.2  HCT 39.6*  PLT 164    ------------------------------------------------------------------------------------------------------------------  Chemistries   Recent Labs Lab 04/18/15 0440  NA 141  K 4.0  CL 108  CO2 28  GLUCOSE 79  BUN 9  CREATININE 0.76  CALCIUM 8.7*   ------------------------------------------------------------------------------------------------------------------  Cardiac Enzymes  Recent Labs Lab 04/16/15 1748  TROPONINI <0.03   ------------------------------------------------------------------------------------------------------------------  RADIOLOGY:  Nm Pulmonary Perf And Vent  04/17/2015  CLINICAL DATA:  Hypoxemia.  COPD.  Diabetes.  Renal insufficiency. EXAM: NUCLEAR MEDICINE VENTILATION - PERFUSION LUNG SCAN TECHNIQUE: Ventilation images were obtained in multiple projections using inhaled aerosol Tc-3555m DTPA. Perfusion images were obtained in multiple projections after intravenous injection of Tc-8255m MAA. RADIOPHARMACEUTICALS:  43.7 Technetium-2455m DTPA aerosol inhalation and 3.8 Technetium-7255m MAA IV COMPARISON:  Chest radiograph of 04/16/2015. FINDINGS: Ventilation: Heterogeneous ventilation, without focal well-defined ventilation defect. This may relate to air trapping. Perfusion: No wedge shaped peripheral perfusion defects to suggest acute pulmonary embolism. Degraded exam, as patient was not able to lay on the scanning table. Therefore, only frontal views were performed. IMPRESSION: No evidence of pulmonary embolism.  Limitations as detailed above. Electronically Signed   By: Jeronimo GreavesKyle  Talbot M.D.   On: 04/17/2015 15:54     ASSESSMENT AND PLAN:   80 year old male with past medical history of COPD, diabetes, hypertension, seizure disorder, vascular dementia, history of previous DVT, aortic dissection, who presented to the hospital due to altered mental status and also noted to be hypotensive.  #1 altered mental status-etiology unclear. Suspected to be seizures with postictal  state that seen by neurology and that's not their suspicion. CT head on admission was negative, no obvious infectious source. -No acute  metabolic abnormality. Questionable related to transient hypoxia. -Mental status much improved today and back to baseline.  #2 hypoxia-status post VQ scan showing low probability for pulmonary embolism. -No much improved. Questionable underlying aspiration/bronchitis. Continue Zithromax.  #3 history of seizures-continue Depakote. No acute seizure type activity overnight.  #4 dementia-continue Namenda.  #5 hypertension-continue metoprolol.  #6 GERD-continue Protonix.   All the records are reviewed and case discussed with Care Management/Social Workerr. Management plans discussed with the patient, family and they are in agreement.  CODE STATUS: DO NOT RESUSCITATE  DVT Prophylaxis: Heparin subcutaneous  TOTAL TIME TAKING CARE OF THIS PATIENT: 30 minutes.   POSSIBLE D/C IN 1-2 DAYS, DEPENDING ON CLINICAL CONDITION.   Houston Siren M.D on 04/19/2015 at 1:11 PM  Between 7am to 6pm - Pager - [phone]  After 6pm go to [email or web address] - password [email or web address] [facility]  [facility]  Office  [phone]  CC: Primary care physician; No primary care provider on file.

## 2015-04-19 NOTE — Plan of Care (Signed)
Problem: Discharge Progression Outcomes Goal: Other Discharge Outcomes/Goals Outcome: Progressing Pt very lethargic  meds whole in applesauce otherwise poor intake Had a call from Burnis Kingfisheriffany Shelton, Charity fundraiserN at [facility].  If d/c plan is to return to [facility], she or another nurse will have to come reassess pt.

## 2015-04-19 NOTE — Plan of Care (Signed)
Problem: Discharge Progression Outcomes Goal: Other Discharge Outcomes/Goals Outcome: Progressing VSS. O2 sats in the high 90's on room air. Alert to self, calm, pleasant. No neurological changes during the shift. Better appetite today.

## 2015-04-20 DIAGNOSIS — R5383 Other fatigue: Secondary | ICD-10-CM | POA: Diagnosis not present

## 2015-04-20 LAB — GLUCOSE, CAPILLARY
Glucose-Capillary: 80 mg/dL (ref 65–99)
Glucose-Capillary: 71 mg/dL (ref 65–99)

## 2015-04-20 MED ORDER — GUAIFENESIN 100 MG/5ML PO SOLN
10.0000 mL | Freq: Once | ORAL | Status: AC
  Administered 2015-04-20: 200 mg via ORAL

## 2015-04-20 NOTE — Progress Notes (Signed)
Pt's CBG is 71.  Orange juice and peanut butter crackers given so that CBG doesn't fall more during transport via EMS.  Orson Apeanielle Maliah Pyles, RN

## 2015-04-20 NOTE — Plan of Care (Signed)
Problem: Discharge Progression Outcomes Goal: Other Discharge Outcomes/Goals Outcome: Progressing VSS. O2 sats in the high 90's on room air. Alert to self, calm, pleasant. No neurological changes during the shift.

## 2015-04-20 NOTE — Progress Notes (Signed)
Patient is medically stable for D/C back to [facility] today. Per [facility] Coordinator at [facility] patient can return today and she will not be coming to assess him at [facility]. Clinical Education officer, museum (CSW) prepared D/C packet and faxed D/C Summary and FL2 to [employer]. RN will call report to Wannetta Sender and arrange EMS for transport. CSW contacted patient's nephew [employer]PO[employer] Kyung Rudd and made her aware of above. CSW met with patient and made him aware of above. Please reconsult if future social work needs arise. CSW signing off.   Blima Rich, [place] [phone]

## 2015-04-20 NOTE — Progress Notes (Signed)
[facility].  Problem with FL2 and social worker Fredric MareBailey took care of issue.  Pt's IVs removed per policy and EMS called for transport.  Orson Apeanielle Nury Nebergall, RN

## 2015-04-20 NOTE — Discharge Summary (Signed)
[facility] - [place] at [facility]   PATIENT NAME: [name]    MR#:  [id number]  DATE OF BIRTH:  [birth year]  DATE OF ADMISSION:  04/16/2015 ADMITTING PHYSICIAN: Arnaldo Natal, MD  DATE OF DISCHARGE: 04/20/2015  PRIMARY CARE PHYSICIAN: No primary care provider on file.    ADMISSION DIAGNOSIS:  Stupor [R40.1]  DISCHARGE DIAGNOSIS:  Active Problems:   Lethargy   Pressure ulcer   SECONDARY DIAGNOSIS:   Past Medical History  Diagnosis Date  . COPD (chronic obstructive pulmonary disease) (HCC)   . Diabetes mellitus   . Hypertension   . Aortic dissection (HCC)     type B  . Deep vein thrombosis (HCC)     history of  . Renal insufficiency   . Movement disorder   . Seizure disorder (HCC)   . Essential hypertension, benign 11/11/2013  . Vascular dementia of acute onset without behavioral disturbance     HOSPITAL COURSE:   80 year old male with past medical history of COPD, diabetes, hypertension, seizure disorder, vascular dementia, history of previous DVT, aortic dissection, who presented to the hospital due to altered mental status and also noted to be hypotensive.  #1 altered mental status-etiology unclear but on admission was suspected to be secondary to seizures with a postictal state. Patient was seen by neurology who did not think that the patient had acute seizures or was in a postictal state. Patient had some transient hypoxia on admission which possibly could have caused the patient's altered mental status. There has been no evidence of acute infectious/metabolic abnormality while in the hospital course. -With supportive care patient's mental status improved is not back to baseline. Empirically patient was treated for acute bronchitis.  #2 hypoxia-patient had a chest 60 which was negative. He also had a VQ scan which was low probability for pulmonary embolism. His hypoxia has improved. He has been empirically treated for acute bronchitis  with Zithromax.  #3 history of seizures-patient will continue his Depakote and he had no acute seizure type activity while in the hospital.  #4 dementia-patient will continue Namenda.  #5 hypertension-patient will continue metoprolol.  #6 GERD-patient will continue omeprazole.  #7 diarrhea-patient was ruled out for C. difficile and is clinically doing better and has no further diarrhea on the day of discharge.  DISCHARGE CONDITIONS:   Stable  CONSULTS OBTAINED:  Treatment Team:  Pauletta Browns, MD  DRUG ALLERGIES:  No Known Allergies  DISCHARGE MEDICATIONS:   Current Discharge Medication List    CONTINUE these medications which have NOT CHANGED   Details  acetaminophen (TYLENOL) 500 MG tablet Take 1,000 mg by mouth 3 (three) times daily. Pt also takes every six hours PRN.    aspirin EC 81 MG tablet Take 81 mg by mouth daily.    divalproex (DEPAKOTE SPRINKLE) 125 MG capsule Take 500 mg by mouth 2 (two) times daily.    gabapentin (NEURONTIN) 100 MG capsule Take 100 mg by mouth 3 (three) times daily.    guaifenesin (ROBITUSSIN) 100 MG/5ML syrup Take 200 mg by mouth 3 (three) times daily.     loperamide (IMODIUM) 2 MG capsule Take 2-4 mg by mouth as needed for diarrhea or loose stools.    Melatonin 1 MG TABS Take 1 mg by mouth at bedtime.    memantine (NAMENDA) 10 MG tablet Take 10 mg by mouth 2 (two) times daily.    metoprolol tartrate (LOPRESSOR) 25 MG tablet Take 25 mg by mouth 2 (two) times daily.  omeprazole (PRILOSEC) 20 MG capsule Take 20 mg by mouth daily.    Skin Protectants, Misc. (ENDIT EX) Apply 1 application topically 2 (two) times daily as needed (for irritation).      STOP taking these medications     azithromycin (ZITHROMAX) 250 MG tablet          DISCHARGE INSTRUCTIONS:   DIET:  Regular diet  DISCHARGE CONDITION:  Stable  ACTIVITY:  Activity as tolerated  OXYGEN:  Home Oxygen: No.   Oxygen Delivery: room air  DISCHARGE  LOCATION:  group home   If you experience worsening of your admission symptoms, develop shortness of breath, life threatening emergency, suicidal or homicidal thoughts you must seek medical attention immediately by calling 911 or calling your MD immediately  if symptoms less severe.  You Must read complete instructions/literature along with all the possible adverse reactions/side effects for all the Medicines you take and that have been prescribed to you. Take any new Medicines after you have completely understood and accpet all the possible adverse reactions/side effects.   Please note  You were cared for by a hospitalist during your hospital stay. If you have any questions about your discharge medications or the care you received while you were in the hospital after you are discharged, you can call the unit and asked to speak with the hospitalist on call if the hospitalist that took care of you is not available. Once you are discharged, your primary care physician will handle any further medical issues. Please note that NO REFILLS for any discharge medications will be authorized once you are discharged, as it is imperative that you return to your primary care physician (or establish a relationship with a primary care physician if you do not have one) for your aftercare needs so that they can reassess your need for medications and monitor your lab values.     Today   Positive cough. No fever, chills, nausea, vomiting. diarrhea improved. Wants to go home  VITAL SIGNS:  Blood pressure 156/69, pulse 49, temperature 98.6 F (37 C), temperature source Oral, resp. rate 18, height 5\' 11"  (1.803 m), weight 78.744 kg (173 lb 9.6 oz), SpO2 97 %.  I/O:   Intake/Output Summary (Last 24 hours) at 04/20/15 0902 Last data filed at 04/20/15 0523  Gross per 24 hour  Intake 2922.92 ml  Output    400 ml  Net 2522.92 ml    PHYSICAL EXAMINATION:  GENERAL:  80 y.o.-year-old patient lying in the bed in no  acute distress.  EYES: Pupils equal, round, reactive to light and accommodation. No scleral icterus. Extraocular muscles intact.  HEENT: Head atraumatic, normocephalic. Oropharynx and nasopharynx clear.  NECK:  Supple, no jugular venous distention. No thyroid enlargement, no tenderness.  LUNGS: Normal breath sounds bilaterally, no wheezing, rales,rhonchi. No use of accessory muscles of respiration.  CARDIOVASCULAR: S1, S2 normal. No murmurs, rubs, or gallops.  ABDOMEN: Soft, non-tender, non-distended. Bowel sounds present. No organomegaly or mass.  EXTREMITIES: No pedal edema, cyanosis, or clubbing.  NEUROLOGIC: Cranial nerves II through XII are intact. No focal motor or sensory defecits b/l.  PSYCHIATRIC: The patient is alert and oriented x 3. Good affect.  SKIN: No obvious rash, lesion, or ulcer.   DATA REVIEW:   CBC  Recent Labs Lab 04/16/15 1714  WBC 8.6  HGB 13.2  HCT 39.6*  PLT 164    Chemistries   Recent Labs Lab 04/18/15 0440  NA 141  K 4.0  CL 108  CO2  28  GLUCOSE 79  BUN 9  CREATININE 0.76  CALCIUM 8.7*    Cardiac Enzymes  Recent Labs Lab 04/16/15 1748  TROPONINI <0.03    Microbiology Results  Results for orders placed or performed during the hospital encounter of 04/16/15  Culture, blood (routine x 2)     Status: None (Preliminary result)   Collection Time: 04/16/15  5:14 PM  Result Value Ref Range Status   Specimen Description BLOOD  Final   Special Requests NONE  Final   Culture NO GROWTH 4 DAYS  Final   Report Status PENDING  Incomplete  Culture, blood (routine x 2)     Status: None (Preliminary result)   Collection Time: 04/16/15  5:49 PM  Result Value Ref Range Status   Specimen Description BLOOD  Final   Special Requests NONE  Final   Culture NO GROWTH 4 DAYS  Final   Report Status PENDING  Incomplete  Urine culture     Status: None   Collection Time: 04/16/15  7:26 PM  Result Value Ref Range Status   Specimen Description URINE,  CLEAN CATCH  Final   Special Requests NONE  Final   Culture NO GROWTH 2 DAYS  Final   Report Status 04/18/2015 FINAL  Final  C difficile quick scan w PCR reflex     Status: None   Collection Time: 04/19/15  9:19 PM  Result Value Ref Range Status   C Diff antigen NEGATIVE NEGATIVE Final   C Diff toxin NEGATIVE NEGATIVE Final   C Diff interpretation Negative for C. difficile  Final    RADIOLOGY:  No results found.    Management plans discussed with the patient, family and they are in agreement.  CODE STATUS:     Code Status Orders        Start     Ordered   04/17/15 0035  Do not attempt resuscitation (DNR)   Continuous    Question Answer Comment  In the event of cardiac or respiratory ARREST Do not call a "code blue"   In the event of cardiac or respiratory ARREST Do not perform Intubation, CPR, defibrillation or ACLS   In the event of cardiac or respiratory ARREST Use medication by any route, position, wound care, and other measures to relive pain and suffering. May use oxygen, suction and manual treatment of airway obstruction as needed for comfort.      04/17/15 0034      TOTAL TIME TAKING CARE OF THIS PATIENT: 40 minutes.    Houston Siren M.D on 04/20/2015 at 9:02 AM  Between 7am to 6pm - Pager - [phone]  After 6pm go to [email or web address] - password [email or web address] [facility]  [place] [facility]  Office  [phone]  CC: Primary care physician; No primary care provider on file.

## 2015-04-22 LAB — CULTURE, BLOOD (ROUTINE X 2)
CULTURE: NO GROWTH
Culture: NO GROWTH

## 2015-04-30 ENCOUNTER — Observation Stay
Admission: EM | Admit: 2015-04-30 | Discharge: 2015-05-02 | Payer: Medicare Other | Attending: Internal Medicine | Admitting: Internal Medicine

## 2015-04-30 ENCOUNTER — Encounter: Payer: Self-pay | Admitting: Intensive Care

## 2015-04-30 ENCOUNTER — Emergency Department: Payer: Medicare Other

## 2015-04-30 DIAGNOSIS — R4182 Altered mental status, unspecified: Secondary | ICD-10-CM | POA: Diagnosis present

## 2015-04-30 DIAGNOSIS — G459 Transient cerebral ischemic attack, unspecified: Secondary | ICD-10-CM | POA: Diagnosis not present

## 2015-04-30 DIAGNOSIS — Z825 Family history of asthma and other chronic lower respiratory diseases: Secondary | ICD-10-CM | POA: Diagnosis not present

## 2015-04-30 DIAGNOSIS — Z8249 Family history of ischemic heart disease and other diseases of the circulatory system: Secondary | ICD-10-CM | POA: Insufficient documentation

## 2015-04-30 DIAGNOSIS — G259 Extrapyramidal and movement disorder, unspecified: Secondary | ICD-10-CM | POA: Diagnosis not present

## 2015-04-30 DIAGNOSIS — Z79899 Other long term (current) drug therapy: Secondary | ICD-10-CM | POA: Insufficient documentation

## 2015-04-30 DIAGNOSIS — Z7401 Bed confinement status: Secondary | ICD-10-CM | POA: Insufficient documentation

## 2015-04-30 DIAGNOSIS — I10 Essential (primary) hypertension: Secondary | ICD-10-CM | POA: Insufficient documentation

## 2015-04-30 DIAGNOSIS — Z8679 Personal history of other diseases of the circulatory system: Secondary | ICD-10-CM | POA: Diagnosis not present

## 2015-04-30 DIAGNOSIS — G40909 Epilepsy, unspecified, not intractable, without status epilepticus: Secondary | ICD-10-CM | POA: Diagnosis not present

## 2015-04-30 DIAGNOSIS — F0390 Unspecified dementia without behavioral disturbance: Secondary | ICD-10-CM | POA: Insufficient documentation

## 2015-04-30 DIAGNOSIS — R918 Other nonspecific abnormal finding of lung field: Secondary | ICD-10-CM | POA: Diagnosis not present

## 2015-04-30 DIAGNOSIS — E119 Type 2 diabetes mellitus without complications: Secondary | ICD-10-CM | POA: Diagnosis not present

## 2015-04-30 DIAGNOSIS — Z7982 Long term (current) use of aspirin: Secondary | ICD-10-CM | POA: Diagnosis not present

## 2015-04-30 DIAGNOSIS — J449 Chronic obstructive pulmonary disease, unspecified: Secondary | ICD-10-CM | POA: Insufficient documentation

## 2015-04-30 DIAGNOSIS — I639 Cerebral infarction, unspecified: Secondary | ICD-10-CM | POA: Diagnosis present

## 2015-04-30 DIAGNOSIS — Z66 Do not resuscitate: Secondary | ICD-10-CM | POA: Diagnosis not present

## 2015-04-30 DIAGNOSIS — R05 Cough: Secondary | ICD-10-CM | POA: Insufficient documentation

## 2015-04-30 DIAGNOSIS — Z87891 Personal history of nicotine dependence: Secondary | ICD-10-CM | POA: Diagnosis not present

## 2015-04-30 DIAGNOSIS — Z820 Family history of epilepsy and other diseases of the nervous system: Secondary | ICD-10-CM | POA: Diagnosis not present

## 2015-04-30 DIAGNOSIS — I739 Peripheral vascular disease, unspecified: Secondary | ICD-10-CM | POA: Diagnosis not present

## 2015-04-30 DIAGNOSIS — Z86718 Personal history of other venous thrombosis and embolism: Secondary | ICD-10-CM | POA: Insufficient documentation

## 2015-04-30 DIAGNOSIS — R29810 Facial weakness: Secondary | ICD-10-CM | POA: Diagnosis not present

## 2015-04-30 LAB — CBC
HEMATOCRIT: 38.7 % — AB (ref 40.0–52.0)
HEMOGLOBIN: 12.5 g/dL — AB (ref 13.0–18.0)
MCH: 29.3 pg (ref 26.0–34.0)
MCHC: 32.3 g/dL (ref 32.0–36.0)
MCV: 90.5 fL (ref 80.0–100.0)
Platelets: 190 10*3/uL (ref 150–440)
RBC: 4.27 MIL/uL — ABNORMAL LOW (ref 4.40–5.90)
RDW: 15 % — ABNORMAL HIGH (ref 11.5–14.5)
WBC: 7.8 10*3/uL (ref 3.8–10.6)

## 2015-04-30 LAB — CBC WITH DIFFERENTIAL/PLATELET
Basophils Absolute: 0 K/uL (ref 0–0.1)
Basophils Relative: 0 %
Eosinophils Absolute: 0 K/uL (ref 0–0.7)
Eosinophils Relative: 0 %
HCT: 39.2 % — ABNORMAL LOW (ref 40.0–52.0)
Hemoglobin: 12.7 g/dL — ABNORMAL LOW (ref 13.0–18.0)
Lymphocytes Relative: 5 %
Lymphs Abs: 0.5 K/uL — ABNORMAL LOW (ref 1.0–3.6)
MCH: 29.5 pg (ref 26.0–34.0)
MCHC: 32.4 g/dL (ref 32.0–36.0)
MCV: 90.9 fL (ref 80.0–100.0)
Monocytes Absolute: 0.8 K/uL (ref 0.2–1.0)
Monocytes Relative: 8 %
Neutro Abs: 8.1 K/uL — ABNORMAL HIGH (ref 1.4–6.5)
Neutrophils Relative %: 87 %
Platelets: 199 K/uL (ref 150–440)
RBC: 4.31 MIL/uL — ABNORMAL LOW (ref 4.40–5.90)
RDW: 14.9 % — ABNORMAL HIGH (ref 11.5–14.5)
WBC: 9.3 K/uL (ref 3.8–10.6)

## 2015-04-30 LAB — URINALYSIS COMPLETE WITH MICROSCOPIC (ARMC ONLY)
BACTERIA UA: NONE SEEN
Bilirubin Urine: NEGATIVE
GLUCOSE, UA: NEGATIVE mg/dL
Hgb urine dipstick: NEGATIVE
Leukocytes, UA: NEGATIVE
NITRITE: NEGATIVE
Protein, ur: NEGATIVE mg/dL
Specific Gravity, Urine: 1.026 (ref 1.005–1.030)
Squamous Epithelial / LPF: NONE SEEN
pH: 6 (ref 5.0–8.0)

## 2015-04-30 LAB — BASIC METABOLIC PANEL
ANION GAP: 9 (ref 5–15)
BUN: 16 mg/dL (ref 6–20)
CALCIUM: 8.7 mg/dL — AB (ref 8.9–10.3)
CHLORIDE: 104 mmol/L (ref 101–111)
CO2: 27 mmol/L (ref 22–32)
Creatinine, Ser: 0.84 mg/dL (ref 0.61–1.24)
GFR calc Af Amer: >60 mL/min (ref >60)
GFR calc non Af Amer: >60 mL/min (ref >60)
GLUCOSE: 92 mg/dL (ref 65–99)
Potassium: 3.9 mmol/L (ref 3.5–5.1)
Sodium: 140 mmol/L (ref 135–145)

## 2015-04-30 LAB — URINE DRUG SCREEN, QUALITATIVE (ARMC ONLY)
Amphetamines, Ur Screen: NOT DETECTED
Barbiturates, Ur Screen: NOT DETECTED
Benzodiazepine, Ur Scrn: NOT DETECTED
Cannabinoid 50 Ng, Ur ~~LOC~~: NOT DETECTED
Cocaine Metabolite,Ur ~~LOC~~: NOT DETECTED
MDMA (Ecstasy)Ur Screen: NOT DETECTED
Methadone Scn, Ur: NOT DETECTED
Opiate, Ur Screen: NOT DETECTED
Phencyclidine (PCP) Ur S: NOT DETECTED
Tricyclic, Ur Screen: NOT DETECTED

## 2015-04-30 LAB — ETHANOL: Alcohol, Ethyl (B): <5 mg/dL

## 2015-04-30 LAB — CREATININE, SERUM
Creatinine, Ser: 0.77 mg/dL (ref 0.61–1.24)
GFR calc Af Amer: >60 mL/min (ref >60)

## 2015-04-30 LAB — TROPONIN I: Troponin I: <0.03 ng/mL

## 2015-04-30 LAB — VALPROIC ACID LEVEL: VALPROIC ACID LVL: 68 ug/mL (ref 50.0–100.0)

## 2015-04-30 LAB — APTT: aPTT: 33 s (ref 24–36)

## 2015-04-30 LAB — PROTIME-INR
INR: 1.14
Prothrombin Time: 14.8 s (ref 11.4–15.0)

## 2015-04-30 MED ORDER — SODIUM CHLORIDE 0.9 % IV SOLN: 250.0000 mL | INTRAVENOUS | Status: DC | PRN

## 2015-04-30 MED ORDER — GABAPENTIN 100 MG PO CAPS
100.0000 mg | ORAL_CAPSULE | Freq: Three times a day (TID) | ORAL | Status: DC
  Administered 2015-05-01 – 2015-05-02 (×5): 100 mg via ORAL
  Filled 2015-04-30 (×5): qty 1

## 2015-04-30 MED ORDER — DIVALPROEX SODIUM 125 MG PO CSDR
500.0000 mg | DELAYED_RELEASE_CAPSULE | Freq: Two times a day (BID) | ORAL | Status: DC
  Administered 2015-05-01 – 2015-05-02 (×4): 500 mg via ORAL
  Filled 2015-04-30 (×4): qty 4

## 2015-04-30 MED ORDER — SODIUM CHLORIDE 0.9 % IV BOLUS (SEPSIS): 1000.0000 mL | Freq: Once | INTRAVENOUS | Status: DC

## 2015-04-30 MED ORDER — SODIUM CHLORIDE 0.9 % IJ SOLN: 3.0000 mL | INTRAMUSCULAR | Status: DC | PRN

## 2015-04-30 MED ORDER — METOPROLOL TARTRATE 25 MG PO TABS
25.0000 mg | ORAL_TABLET | Freq: Two times a day (BID) | ORAL | Status: DC
  Administered 2015-05-01 – 2015-05-02 (×4): 25 mg via ORAL
  Filled 2015-04-30 (×4): qty 1

## 2015-04-30 MED ORDER — ACETAMINOPHEN 500 MG PO TABS
1000.0000 mg | ORAL_TABLET | Freq: Three times a day (TID) | ORAL | Status: DC
  Administered 2015-05-01 – 2015-05-02 (×5): 1000 mg via ORAL
  Filled 2015-04-30 (×5): qty 2

## 2015-04-30 MED ORDER — MELATONIN 1 MG PO TABS: 1.0000 mg | ORAL_TABLET | Freq: Every day | ORAL | Status: DC

## 2015-04-30 MED ORDER — ACETAMINOPHEN 325 MG PO TABS: 650.0000 mg | ORAL_TABLET | Freq: Four times a day (QID) | ORAL | Status: DC | PRN

## 2015-04-30 MED ORDER — SODIUM CHLORIDE 0.9 % IJ SOLN
3.0000 mL | Freq: Two times a day (BID) | INTRAMUSCULAR | Status: DC
  Administered 2015-05-01 – 2015-05-02 (×4): 3 mL via INTRAVENOUS

## 2015-04-30 MED ORDER — ACETAMINOPHEN 650 MG RE SUPP
650.0000 mg | Freq: Four times a day (QID) | RECTAL | Status: DC | PRN
Start: 2015-04-30 — End: 2015-05-02

## 2015-04-30 MED ORDER — ONDANSETRON HCL 4 MG PO TABS: 4.0000 mg | ORAL_TABLET | Freq: Four times a day (QID) | ORAL | Status: DC | PRN

## 2015-04-30 MED ORDER — MEMANTINE HCL 10 MG PO TABS
10.0000 mg | ORAL_TABLET | Freq: Two times a day (BID) | ORAL | Status: DC
  Administered 2015-05-01 – 2015-05-02 (×4): 10 mg via ORAL
  Filled 2015-04-30 (×4): qty 1

## 2015-04-30 MED ORDER — ASPIRIN EC 81 MG PO TBEC
81.0000 mg | DELAYED_RELEASE_TABLET | Freq: Every day | ORAL | Status: DC
  Administered 2015-05-01 – 2015-05-02 (×3): 81 mg via ORAL
  Filled 2015-04-30 (×3): qty 1

## 2015-04-30 MED ORDER — ONDANSETRON HCL 4 MG/2ML IJ SOLN: 4.0000 mg | Freq: Four times a day (QID) | INTRAMUSCULAR | Status: DC | PRN

## 2015-04-30 MED ORDER — LOPERAMIDE HCL 2 MG PO CAPS: 2.0000 mg | ORAL_CAPSULE | ORAL | Status: DC | PRN

## 2015-04-30 MED ORDER — PANTOPRAZOLE SODIUM 40 MG PO TBEC
40.0000 mg | DELAYED_RELEASE_TABLET | Freq: Every day | ORAL | Status: DC
  Administered 2015-05-01 – 2015-05-02 (×3): 40 mg via ORAL
  Filled 2015-04-30 (×3): qty 1

## 2015-04-30 MED ORDER — ENOXAPARIN SODIUM 40 MG/0.4ML ~~LOC~~ SOLN
40.0000 mg | SUBCUTANEOUS | Status: DC
  Administered 2015-05-01 (×2): 40 mg via SUBCUTANEOUS
  Filled 2015-04-30 (×2): qty 0.4

## 2015-04-30 NOTE — ED Notes (Signed)
PAtient arrived by EMS from [facility]. Staff reports they last seen patient normal at 0900. Patient is normally talking and symmetrical facial movements. Per staff patient was not talking and had L sided facial droop.

## 2015-04-30 NOTE — ED Provider Notes (Signed)
[facility] Emergency Department Provider Note   ____________________________________________  Time seen: Upon EMS arrival I have reviewed the triage vital signs and the triage nursing note.  HISTORY  Chief Complaint Seizures  altered mental status, left-sided facial droop  Historian Limited: Patient is unable to provide any history and has history of dementia EMS provided some history History obtained from staff member at nursing home  HPI [name] is a 80 y.o. male is here for evaluation for possible acute stroke per nursing home staff, as related through EMS report. On EMS report there was reportedly a staff member who noted left facial droop upon coming into the room this afternoon. They were unclear whether or not he was last seen normal this morning or yesterday. Patient is able to open his eyes to some verbal and sternal rub, but unable to provide any history.  When I was able to get in touch with a staff member at the nursing home, she stated that she saw him normal around 9 AM after breakfast when he was conversing with her. She states he has had some decline over the past several weeks in terms of his dementia, including periods where he was less verbal. She thought that the left facial droop was very new. Reported that the patient has a history of seizures, but no witnessed seizure activity.    Past Medical History  Diagnosis Date  . COPD (chronic obstructive pulmonary disease) (HCC)   . Diabetes mellitus   . Hypertension   . Aortic dissection (HCC)     type B  . Deep vein thrombosis (HCC)     history of  . Renal insufficiency   . Movement disorder   . Seizure disorder (HCC)   . Essential hypertension, benign 11/11/2013  . Vascular dementia of acute onset without behavioral disturbance     Patient Active Problem List   Diagnosis Date Noted  . Pressure ulcer 04/18/2015  . Lethargy 04/16/2015  . Essential hypertension, benign 11/11/2013   . GERD (gastroesophageal reflux disease) 11/11/2013  . Vascular dementia without behavioral disturbance 11/11/2013  . Routine general medical examination at a health care facility 10/17/2012  . B12 DEFICIENCY 04/02/2009  . Seizure disorder (HCC) 04/02/2009  . DIABETES MELLITUS, TYPE II 09/26/2006  . COPD (chronic obstructive pulmonary disease) (HCC) 09/26/2006    Past Surgical History  Procedure Laterality Date  . Appendectomy    . Hernia repair    . Pelvic and para-aortic lymph node dissection      type B    Current Outpatient Rx  Name  Route  Sig  Dispense  Refill  . acetaminophen (TYLENOL) 500 MG tablet   Oral   Take 1,000 mg by mouth 3 (three) times daily.          Marland Kitchen aspirin EC 81 MG tablet   Oral   Take 81 mg by mouth daily.         . divalproex (DEPAKOTE SPRINKLE) 125 MG capsule   Oral   Take 500 mg by mouth 2 (two) times daily.         Marland Kitchen gabapentin (NEURONTIN) 100 MG capsule   Oral   Take 100 mg by mouth 3 (three) times daily.         Marland Kitchen loperamide (IMODIUM) 2 MG capsule   Oral   Take 2-4 mg by mouth as needed for diarrhea or loose stools.         . Melatonin 1 MG TABS  Oral   Take 1 mg by mouth at bedtime.         . memantine (NAMENDA) 10 MG tablet   Oral   Take 10 mg by mouth 2 (two) times daily.         . metoprolol tartrate (LOPRESSOR) 25 MG tablet   Oral   Take 25 mg by mouth 2 (two) times daily.         Marland Kitchen omeprazole (PRILOSEC) 20 MG capsule   Oral   Take 20 mg by mouth daily.         . Skin Protectants, Misc. (ENDIT EX)   Apply externally   Apply 1 application topically 2 (two) times daily as needed (for irritation).           Allergies Review of patient's allergies indicates no known allergies.  Family History  Problem Relation Age of Onset  . Alzheimer's disease Mother   . Hypertension Mother   . Heart disease Father   . COPD Brother     Social History Social History  Substance Use Topics  . Smoking  status: Former Smoker -- 1.00 packs/day for 30 years    Types: Cigarettes    Quit date: 06/28/1980  . Smokeless tobacco: Never Used  . Alcohol Use: No    Review of Systems Limited due to patient with altered mental status. Constitutional: Negative for fever. Eyes: Negative for visual changes. ENT: Negative for sore throat. Cardiovascular: Negative for chest pain. Respiratory: Positive for cough.. Gastrointestinal: Negative for abdominal pain. Genitourinary: Negative for dysuria. Musculoskeletal: Negative for back pain. Skin: Negative for rash. Neurological: Negative for headache. 10 point Review of Systems otherwise negative ____________________________________________   PHYSICAL EXAM:  VITAL SIGNS: ED Triage Vitals  Enc Vitals Group     BP --      Pulse --      Resp --      Temp --      Temp src --      SpO2 --      Weight --      Height --      Head Cir --      Peak Flow --      Pain Score --      Pain Loc --      Pain Edu? --      Excl. in GC? --      Constitutional: Open eyes to sternal rub and occasionally to his own name. In no distress. Eyes: Conjunctivae are normal. Right pupil approximately 4 mm, left pupil approximately 2 mm. Normal extraocular movements. ENT   Head: Normocephalic and atraumatic.   Nose: No congestion/rhinnorhea.   Mouth/Throat: Mucous membranes are moderately dry.   Neck: No stridor. Cardiovascular/Chest: Normal rate, regular rhythm.  No murmurs, rubs, or gallops. Respiratory: Normal respiratory effort without tachypnea nor retractions. Very deep rhonchorous cough bilaterally and posteriorly. No wheezing.  Gastrointestinal: Soft. No distention, no guarding, no rebound. Nontender   Genitourinary/rectal:Deferred Musculoskeletal: No deformities. No edema. Neurologic:  Opens eyes intermittently to voice, and to sternal rub. Answers a few questions yes or no. No slurred speech. No apparent sensory deficit. Patient is able to  hold both arms up without significant weakness appreciated, however unable to cooperate with grip strength testing. Lower extremities he moves them both without prompting, however cannot cooperate with actual strength testing. No perceived facial droop. Skin:  Skin is warm, dry and intact. No rash noted.   ____________________________________________   EKG I, Governor Rooks, MD,  the attending physician have personally viewed and interpreted all ECGs.  82 bpm. Sinus rhythm. Narrow QRS. Normal axis. Nonspecific ST-T wave. ____________________________________________  LABS (pertinent positives/negatives)  CBC shows white blood cell count 9.3 with a left shift. Hemoglobin 12.7, platelet count 199 Basic metabolic panel within normal limits Troponin less than 0.03 Urinalysis trace ketones and negative for blood, leukocytes, red blood cells, white blood cells, and bacteria Ethanol less than 5 INR 1.14 Urine drug screen pending Valproic acid level pending  ____________________________________________  RADIOLOGY All Xrays were viewed by me. Imaging interpreted by Radiologist.  CT scan head noncontrast: Discussed with the radiologist.  IMPRESSION: 1. No acute intracranial abnormalities. 2. Mild cerebral atrophy with extensive chronic microvascular ischemic changes in cerebral white matter, and multiple right-sided thalamic and basal ganglia lacunar infarcts, unchanged compared to the recent prior study from 04/16/2015.  Chest x-ray portable: IMPRESSION: No active cardiopulmonary disease. Low lung volumes.  __________________________________________  PROCEDURES  Procedure(s) performed: None  Critical Care performed: CRITICAL CARE Performed by: Governor Rooks   Total critical care time: 45 minutes  Critical care time was exclusive of separately billable procedures and treating other patients.  Critical care was necessary to treat or prevent imminent or life-threatening  deterioration.  Critical care was time spent personally by me on the following activities: development of treatment plan with patient and/or surrogate as well as nursing, discussions with consultants, evaluation of patient's response to treatment, examination of patient, obtaining history from patient or surrogate, ordering and performing treatments and interventions, ordering and review of laboratory studies, ordering and review of radiographic studies, pulse oximetry and re-evaluation of patient's condition.   ____________________________________________   ED COURSE / ASSESSMENT AND PLAN  CONSULTATIONS: Specialist on-call neurologist, did not recommend tpa  Pertinent labs & imaging results that were available during my care of the patient were reviewed by me and considered in my medical decision making (see chart for details).  This patient arrived with a concern for possible acute stroke. Initially he reported did not sound like there was a definitive time of onset, although patient was sent immediately to CT scan while I contacted the facility. I spoke with the facility staff member, she did feel confident that there was an acute change since he was last seen normal by her at 9 AM. She reported that he was not talking which is a change and acute mental status, and had left facial droop. Here on exam he does not have a left facial droop, nor does he have a focal neurologic deficit that the pre-shovel. He does not respond much to questioning or following commands, which could still be a change in his baseline mental status.  Clinically he has more concerned about a metabolic source of altered mental status with an acute stroke, however with the complaint 71-year-old 2  About left facial droop, and patient being about 4 hours from last seen normal, I did go ahead and initiate the code stroke with neurology consultation by the specialist on-call group.  CT head was read as negative for acute  stroke by radiologist. On-call neurologist did not feel patient was being affected by an acute stroke at present, and other such as metabolic or infectious source was more likely.  Patient is not a TPA candidate due to improving deficit, as left facial droop is now gone, and whereas he was initially nonverbal, he is somewhat verbal again.  There is a history of seizures, although no seizure activity was witnessed, he could've had a  seizure and is now postictal.  Chest x-ray shows no pneumonia, although he has had a cough.  Patient be admitted for further TIA workup.  Patient / Family / Caregiver informed of clinical course, medical decision-making process, and agree with plan.   ___________________________________________   FINAL CLINICAL IMPRESSION(S) / ED DIAGNOSES   Final diagnoses:  Altered mental status, unspecified altered mental status type  Transient cerebral ischemia, unspecified transient cerebral ischemia type       Governor Rooksebecca Melissaann Dizdarevic, MD 04/30/15 1728

## 2015-04-30 NOTE — Progress Notes (Signed)
   04/30/15 1300  Clinical Encounter Type  Visited With Patient  Visit Type Initial  Referral From Nurse  Consult/Referral To Chaplain  Received a Code Stroke page on patient. Patient was unresponsive and no family was present. Will return to check back when family arrives. Chaplain Cordelia PocheEmily Kyliegh Jester

## 2015-04-30 NOTE — H&P (Signed)
[facility] [place] [facility]   PATIENT NAME: Brian HumanJohn Sosa    MR#:  [id number]  DATE OF BIRTH:  02/05/1935  DATE OF ADMISSION:  04/30/2015  PRIMARY CARE PHYSICIAN: Drs. making house calls  REQUESTING/REFERRING PHYSICIAN: Dr. Shaune PollackLord  CHIEF COMPLAINT:  Left facial droop  HISTORY OF PRESENT ILLNESS:  [name]  is a 80 y.o. male with a known history of hypertension, diabetes, dementia who is bedbound lives at nursing home comes to the emergency room after staff there noticed patient had left facial droop and drooping of his left eye. Patient has dementia and unable to give any history or review of systems. He does have some cough which is more upper airway cough. Family in the room denies any coughing or choking while swallowing. Left facial droop seems to have improved. Patient is being admitted for possible TIA. PAST MEDICAL HISTORY:   Past Medical History  Diagnosis Date  . COPD (chronic obstructive pulmonary disease) (HCC)   . Diabetes mellitus   . Hypertension   . Aortic dissection (HCC)     type B  . Deep vein thrombosis (HCC)     history of  . Renal insufficiency   . Movement disorder   . Seizure disorder (HCC)   . Essential hypertension, benign 11/11/2013  . Vascular dementia of acute onset without behavioral disturbance     PAST SURGICAL HISTOIRY:   Past Surgical History  Procedure Laterality Date  . Appendectomy    . Hernia repair    . Pelvic and para-aortic lymph node dissection      type B    SOCIAL HISTORY:   Social History  Substance Use Topics  . Smoking status: Former Smoker -- 1.00 packs/day for 30 years    Types: Cigarettes    Quit date: 06/28/1980  . Smokeless tobacco: Never Used  . Alcohol Use: No    FAMILY HISTORY:   Family History  Problem Relation Age of Onset  . Alzheimer's disease Mother   . Hypertension Mother   . Heart disease Father   . COPD Brother     DRUG ALLERGIES:  No Known  Allergies  REVIEW OF SYSTEMS:  Review of Systems  Unable to perform ROS: dementia     MEDICATIONS AT HOME:   Prior to Admission medications   Medication Sig Start Date End Date Taking? Authorizing Provider  acetaminophen (TYLENOL) 500 MG tablet Take 1,000 mg by mouth 3 (three) times daily.    Yes Historical Provider, MD  aspirin EC 81 MG tablet Take 81 mg by mouth daily.   Yes Historical Provider, MD  divalproex (DEPAKOTE SPRINKLE) 125 MG capsule Take 500 mg by mouth 2 (two) times daily.   Yes Historical Provider, MD  gabapentin (NEURONTIN) 100 MG capsule Take 100 mg by mouth 3 (three) times daily.   Yes Historical Provider, MD  loperamide (IMODIUM) 2 MG capsule Take 2-4 mg by mouth as needed for diarrhea or loose stools.   Yes Historical Provider, MD  Melatonin 1 MG TABS Take 1 mg by mouth at bedtime.   Yes Historical Provider, MD  memantine (NAMENDA) 10 MG tablet Take 10 mg by mouth 2 (two) times daily.   Yes Historical Provider, MD  metoprolol tartrate (LOPRESSOR) 25 MG tablet Take 25 mg by mouth 2 (two) times daily.   Yes Historical Provider, MD  omeprazole (PRILOSEC) 20 MG capsule Take 20 mg by mouth daily.   Yes Historical Provider, MD  Skin Protectants, Misc. (ENDIT  EX) Apply 1 application topically 2 (two) times daily as needed (for irritation).   Yes Historical Provider, MD      VITAL SIGNS:  Blood pressure 151/95, pulse 82, temperature 98.4 F (36.9 C), temperature source Oral, resp. rate 22, weight 69.446 kg (153 lb 1.6 oz), SpO2 94 %.  PHYSICAL EXAMINATION:  GENERAL:  80 y.o.-year-old patient lying in the bed with no acute distress. Thin and lean. EYES: Pupils equal, round, reactive to light and accommodation. No scleral icterus. Extraocular muscles intact.  HEENT: Head atraumatic, normocephalic. Oropharynx and nasopharynx clear. Edentulous NECK:  Supple, no jugular venous distention. No thyroid enlargement, no tenderness.  LUNGS: Normal breath sounds bilaterally, no  wheezing, rales,rhonchi or crepitation. No use of accessory muscles of respiration.  CARDIOVASCULAR: S1, S2 normal. No murmurs, rubs, or gallops.  ABDOMEN: Soft, nontender, nondistended. Bowel sounds present. No organomegaly or mass.  EXTREMITIES: No pedal edema, cyanosis, or clubbing.  NEUROLOGIC: Cranial nerves II through XII are intact. Muscle strength 5/5 in all extremities. Sensation intact. Gait not checked. No focal neuro deficit PSYCHIATRIC: The patient is alert and confused due to dementia SKIN: No obvious rash, lesion, or ulcer.   LABORATORY PANEL:   CBC  Recent Labs Lab 04/30/15 1311  WBC 9.3  HGB 12.7*  HCT 39.2*  PLT 199   ------------------------------------------------------------------------------------------------------------------  Chemistries   Recent Labs Lab 04/30/15 1311  NA 140  K 3.9  CL 104  CO2 27  GLUCOSE 92  BUN 16  CREATININE 0.84  CALCIUM 8.7*   ------------------------------------------------------------------------------------------------------------------  Cardiac Enzymes  Recent Labs Lab 04/30/15 1311  TROPONINI <0.03   ------------------------------------------------------------------------------------------------------------------  RADIOLOGY:  Ct Head Wo Contrast  04/30/2015  CLINICAL DATA:  80 year old male with altered mental status. Code stroke. EXAM: CT HEAD WITHOUT CONTRAST TECHNIQUE: Contiguous axial images were obtained from the base of the skull through the vertex without intravenous contrast. COMPARISON:  Head CT 04/16/2015. FINDINGS: Mild cerebral atrophy. Patchy and confluent areas of decreased attenuation are noted throughout the deep and periventricular white matter of the cerebral hemispheres bilaterally, compatible with chronic microvascular ischemic disease. Multiple old lacunar infarcts in the right thalamus and right basal ganglia, similar to the prior examination. No acute intracranial abnormalities. Specifically,  no evidence of acute intracranial hemorrhage, no definite findings of acute/subacute cerebral ischemia, no mass, mass effect, hydrocephalus or abnormal intra or extra-axial fluid collections. Visualized paranasal sinuses and mastoids are well pneumatized. No acute displaced skull fractures are identified. IMPRESSION: 1. No acute intracranial abnormalities. 2. Mild cerebral atrophy with extensive chronic microvascular ischemic changes in cerebral white matter, and multiple right-sided thalamic and basal ganglia lacunar infarcts, unchanged compared to the recent prior study from 04/16/2015. Electronically Signed   By: Trudie Reed M.D.   On: 04/30/2015 13:04   Dg Chest Port 1 View  04/30/2015  CLINICAL DATA:  cough EXAM: PORTABLE CHEST 1 VIEW COMPARISON:  04/16/2015 FINDINGS: Lungs are under aerated and grossly clear. Aortic knob remains prominent. Heart is mildly enlarged. Normal vascularity. No pneumothorax or pleural effusion. IMPRESSION: No active cardiopulmonary disease.  Low lung volumes. Electronically Signed   By: Jolaine Click M.D.   On: 04/30/2015 16:07    IMPRESSION AND PLAN:  80 year old Mr. Gram with past medical history of hypertension, diabetes, COPD, chronic seizure disorder comes to the emergency room with left facial droop and left eyelid droop as noted by the staff at nursing home. During my evaluation patient's symptoms seem to have improved. He is being admitted with  1.  Suspected TIA. Initially presented with left facial droop which is resolved at present. No other neuro deficit noted. Patient has multiple risk factors. CT of the head negative for acute stroke We'll continue his aspirin 81 mg daily.   check echo of the heart. MRI of the brain if patient allows. Patient is bedbound hence I will not order physical therapy Social worker for discharge planning  2. Type 2 diabetes Patient is not on any medications we'll continue sliding scale insulin. Next  3 chronic  seizures Continue Depakote  4. Hypertension we'll resume metoprolol at discharge.  5. DVT prophylaxis Lovenox  All the records are reviewed and case discussed with ED provider. Management plans discussed with the patient, family and they are in agreement.  CODE STATUS: Full   TOTAL TIME TAKING CARE OF THIS PATIENT: 45  minutes.    Geniyah Eischeid M.D on 04/30/2015 at 7:24 PM  Between 7am to 6pm - Pager - [phone]  After 6pm go to [email or web address] - password [email or web address] [facility]  [facility]  Office  [phone]  CC: Primary care physician; No primary care provider on file.

## 2015-04-30 NOTE — Progress Notes (Signed)

## 2015-05-01 ENCOUNTER — Observation Stay: Admit: 2015-05-01 | Payer: Medicare Other

## 2015-05-01 ENCOUNTER — Observation Stay: Payer: Medicare Other

## 2015-05-01 DIAGNOSIS — G459 Transient cerebral ischemic attack, unspecified: Secondary | ICD-10-CM | POA: Diagnosis not present

## 2015-05-01 LAB — MRSA PCR SCREENING: MRSA by PCR: NEGATIVE

## 2015-05-01 NOTE — Progress Notes (Signed)
Dr Sherryll BurgerShah DYS 3 diet, thin liquids

## 2015-05-01 NOTE — Plan of Care (Addendum)
Individualization of Care Pt prefers to be called Kimm Hx of COPD, DM, HTN, aortic dissection, DVT, renal insufficiency, seizure disorder, dementia, movement disorder  Progress toward goals:  Problem: Education: Goal: Knowledge of disease or condition will improve Outcome: Not Progressing Education on stroke diagnosis and risk factors as well as signs and symptoms.  Patient with acute dementia and memory issues.  Unable to respond appropriately about learning.  Problem: Nutrition: Goal: Risk of aspiration will decrease Outcome: Progressing [facility].  On thick liquid and Pureed diet at facility.  Patient NPO in hospital until speech therapy can do swallow screen.  RN did bedside swallow screen with applesauce.  Patient able to swallow pills whole with applesauce.  Problem: Tissue Perfusion: Goal: Complications of Ischemic Stroke will be minimized. Outcome: Progressing Neuro checks every two hours.  Patient with NIH of 15.  He does have baseline slurred speech and memory issues.  Also with baseline sensation and inability to move right and left legs.  Patient is wheelchair bound at baseline.  No changes in neuro this shift.

## 2015-05-01 NOTE — Evaluation (Signed)
Clinical/Bedside Swallow Evaluation Patient Details  Name: [name] MRN: [id number] Date of Birth: 1934-07-26  Today's Date: 05/01/2015 Time: SLP Start Time (ACUTE ONLY): 0830 SLP Stop Time (ACUTE ONLY): 0915 SLP Time Calculation (min) (ACUTE ONLY): 45 min  Past Medical History:  Past Medical History  Diagnosis Date  . COPD (chronic obstructive pulmonary disease) (HCC)   . Diabetes mellitus   . Hypertension   . Aortic dissection (HCC)     type B  . Deep vein thrombosis (HCC)     history of  . Renal insufficiency   . Movement disorder   . Seizure disorder (HCC)   . Essential hypertension, benign 11/11/2013  . Vascular dementia of acute onset without behavioral disturbance    Past Surgical History:  Past Surgical History  Procedure Laterality Date  . Appendectomy    . Hernia repair    . Pelvic and para-aortic lymph node dissection      type B   HPI:  [name] is a 80 y.o. male with a known history of hypertension, diabetes, dementia who is bedbound lives at nursing home comes to the emergency room after staff there noticed patient had left facial droop and drooping of his left eye. Patient has dementia and unable to give any history or review of systems. He does have some cough which is more upper airway cough. Family in the room denies any coughing or choking while swallowing.   Assessment / Plan / Recommendation Clinical Impression  Pt presented with min oral phase dysphagia c/b poor labial seal around spoon on presentation of ice chips, but adaquate labial closure with other bolus consistencies. Pt has a baseline congested cough and known hx of dementia (per chart).  Pt was oriented to task and consumexd 15+ bites of applesauce, and 8 graham crackers soaked in applesauce with no immediate or overt s/s of aspiration noted. Pt began drinking small sips of thin liquids from cup and consumed 5 trials with no overt or immediate s/s of aspiration. Pt then consumed multiple sips  from straw.  After ~4 multiple sips, Pt had coughing episode. ST gave time to recover, then presented additional trials of applesauce before introducing cup drinking of thin liquids again. In final trials of thins by cup, Pt did not demonstrate any immediate or overt s/s of aspiration. Pt would benefit from a dysphagia 2 diet with thin liquids and strict aspiration precautions (no straws). Meds whole in puree as able, but crushed as necessary (and able). NSG updated.    Aspiration Risk  Mild    Diet Recommendation Dysphagia 2 (Fine chop);Thin   Medication Administration: Whole meds with puree (crush if necessary/able) Compensations: Minimize environmental distractions;Slow rate;Small sips/bites;Check for pocketing;Follow solids with liquid    Other  Recommendations Oral Care Recommendations: Oral care QID;Oral care before and after PO;Staff/trained caregiver to provide oral care   Follow Up Recommendations       Frequency and Duration min 3x week  1 week   Pertinent Vitals/Pain None reported    SLP Swallow Goals  See plan of care   Swallow Study Prior Functional Status   Lived at home, diet unknown.    General Date of Onset: 04/30/15 Other Pertinent Information: [name] is a 80 y.o. male with a known history of hypertension, diabetes, dementia who is bedbound lives at nursing home comes to the emergency room after staff there noticed patient had left facial droop and drooping of his left eye. Patient has dementia and unable to give  any history or review of systems. He does have some cough which is more upper airway cough. Family in the room denies any coughing or choking while swallowing. Type of Study: Bedside swallow evaluation Temperature Spikes Noted: No Respiratory Status: Room air History of Recent Intubation: No Behavior/Cognition: Alert;Cooperative;Confused (pt has hx of baseline vascular dementia per chart) Oral Cavity - Dentition: Edentulous Self-Feeding Abilities:  Needs assist;Needs set up Patient Positioning: Upright in bed Baseline Vocal Quality: Wet (adaquate vocal intensity, slightly wet sounding) Volitional Cough: Congested (at baseline) Volitional Swallow: Able to elicit    Oral/Motor/Sensory Function Overall Oral Motor/Sensory Function: Appears within functional limits for tasks assessed Labial Symmetry: Within Functional Limits Lingual Symmetry: Within Functional Limits Facial ROM: Within Functional Limits Facial Symmetry: Within Functional Limits Mandible: Within Functional Limits (as observed in bolus management)   Ice Chips Ice chips: Within functional limits Presentation: Spoon Other Comments: Pt consumed 5 single ice chips w/no overt or immediate s/s of aspiration   Thin Liquid Thin Liquid: Within functional limits Presentation: Cup;Straw Other Comments: Pt had coughing episode after multiple sips by straw, however tolerated cup drinking with no immediate s/s of aspiration noted.    Nectar Thick Nectar Thick Liquid: Not tested   Honey Thick Honey Thick Liquid: Not tested   Puree Puree: Within functional limits Presentation: Spoon Other Comments: Pt consumed 15+ bites of applesauce with no immediate or overt s/s of aspiration   Solid   GO    Solid: Within functional limits Presentation: Spoon Other Comments: Pt consumed 8 graham crackers, soaked in apple sauce with no overt or immediate s/s of aspiration noted.       Adrieanna Boteler 05/01/2015,11:23 AM

## 2015-05-01 NOTE — Progress Notes (Signed)
[name] [name] at [facility]   PATIENT NAME: Brian HumanJohn Sosa    MR#:  [id number]  DATE OF BIRTH:  [birth year]  SUBJECTIVE:  CHIEF COMPLAINT:   Chief Complaint  Patient presents with  . Transient Ischemic Attack   looks close to his baseline  REVIEW OF SYSTEMS:  Review of Systems  Unable to perform ROS: dementia   DRUG ALLERGIES:  No Known Allergies VITALS:  Blood pressure 140/80, pulse 61, temperature 97.9 F (36.6 C), temperature source Oral, resp. rate 18, height 5\' 11"  (1.803 m), weight 69.945 kg (154 lb 3.2 oz), SpO2 96 %. PHYSICAL EXAMINATION:  Physical Exam  Constitutional: He appears malnourished.  HENT:  Head: Normocephalic and atraumatic.  Eyes: Conjunctivae and EOM are normal. Pupils are equal, round, and reactive to light.  Neck: Normal range of motion. Neck supple. No tracheal deviation present. No thyromegaly present.  Cardiovascular: Normal rate, regular rhythm and normal heart sounds.   Pulmonary/Chest: Effort normal and breath sounds normal. No respiratory distress. He has no wheezes. He exhibits no tenderness.  Abdominal: Soft. Bowel sounds are normal. He exhibits no distension. There is no tenderness.  Musculoskeletal: Normal range of motion.  Neurological: He is alert. He is disoriented. No cranial nerve deficit.  Pleasantly confused, demented patient  Skin: Skin is warm and dry. No rash noted.  Psychiatric: Mood and affect normal.   LABORATORY PANEL:   CBC  Recent Labs Lab 04/30/15 2039  WBC 7.8  HGB 12.5*  HCT 38.7*  PLT 190   ------------------------------------------------------------------------------------------------------------------ Chemistries   Recent Labs Lab 04/30/15 1311 04/30/15 2039  NA 140  --   K 3.9  --   CL 104  --   CO2 27  --   GLUCOSE 92  --   BUN 16  --   CREATININE 0.84 0.77  CALCIUM 8.7*  --    RADIOLOGY:  Mr Brain Wo Contrast  05/01/2015  CLINICAL DATA:  80 year old male with  altered mental status, combative. Initial encounter. EXAM: MRI HEAD WITHOUT CONTRAST TECHNIQUE: Multiplanar, multiecho pulse sequences of the brain and surrounding structures were obtained without intravenous contrast. COMPARISON:  Head CT without contrast 04/30/2015 and earlier. Brain MRI 02/19/2009. FINDINGS: The examination had to be discontinued prior to completion due to patient agitation. Axial T1 and coronal T2 weighted imaging was not obtained. Study is intermittently degraded by motion artifact despite repeated imaging atte Mpts. There has been some generalized cerebral volume loss since 2010. Ventricular size and configuration are within normal limits. No midline shift, mass effect, or evidence of intracranial mass lesion. No acute intracranial hemorrhage identified. Chronic intracranial artery dolichoectasia. Grossly stable Major intracranial vascular flow voids . Dominant left vertebral artery re- demonstrated. No restricted diffusion or evidence of acute infarction. Extensive chronic T2 heterogeneity throughout the deep gray matter nuclei has not significantly changed compatible with a combination of chronic lacunar infarcts and dilated perivascular spaces. Superimposed confluent cerebral white matter FLAIR hyperintensity has not significantly changed. Small chronic cerebellar lacunar infarcts have not significantly changed. Grossly negative pituitary and cervicomedullary junction. Orbit and scalp soft tissues are stable. Mild paranasal sinus mucosal thickening is stable. Trace left mastoid effusion has not significantly changed. Small volume retained secretions in the nasopharynx. IMPRESSION: 1.  No acute intracranial abnormality. 2. Advanced chronic small vessel disease appears not significantly changed since 2010. 3. Mildly motion degraded and prematurely discontinued exam due to patient agitation. Electronically Signed   By: Brian FlemingH  [name] M.D.   On: 05/01/2015  12:44   Dg Chest Port 1 View  04/30/2015   CLINICAL DATA:  cough EXAM: PORTABLE CHEST 1 VIEW COMPARISON:  04/16/2015 FINDINGS: Lungs are under aerated and grossly clear. Aortic knob remains prominent. Heart is mildly enlarged. Normal vascularity. No pneumothorax or pleural effusion. IMPRESSION: No active cardiopulmonary disease.  Low lung volumes. Electronically Signed   By: [name] M.D.   On: 04/30/2015 16:07   ASSESSMENT AND PLAN:  80 year old Mr. Barbar with past medical history of hypertension, diabetes, COPD, chronic seizure disorder comes to the emergency room with left facial droop and left eyelid droop as noted by the staff at nursing home. During my evaluation patient's symptoms seem to have improved. He is being admitted with  1. TIA: Ruled out. MRI neg. Initially presented with left facial droop which is resolved at present.  Patient is bedbound hence - no need for physical therapy Social worker for discharge planning - will get palliative care eval - he seems appropriate for hospice at the facility Per Neuro this could be a seizure in which he is adequately controlled - Continue VPA  BID - Continue ASA  daily  2. Type 2 diabetes continue sliding scale insulin.   3 chronic seizures Continue Depakote  4. Hypertension we'll resume metoprolol at discharge.  5. DVT prophylaxis Lovenox    All the records are reviewed and case discussed with Care Management/Social Worker. Management plans discussed with the patient, family and they are in agreement.  CODE STATUS: DO NOT RESUSCITATE   TOTAL TIME TAKING CARE OF THIS PATIENT: 35 minutes.   More than 50% of the time was spent in counseling/coordination of care: YES  POSSIBLE D/C IN AM, DEPENDING ON CLINICAL CONDITION.   [place] Day, [name] M.D on 05/01/2015 at 3:11 PM  Between 7am to 6pm - Pager - [phone]  After 6pm go to [email or web address] - password [email or web address] [facility]  [facility]  Office  [phone]  CC: Primary care physician; No primary care  provider on file.

## 2015-05-01 NOTE — Care Management (Signed)
Admitted to [facility] with the diagnosis of TIA. A resident of [facility] unit since 12/03/13. [facility] to [facility]. Family member is Earleen ReaperBarbara Zimbelman [phone]) Seen by [facility] at the facility. Gwenette GreetBrenda S Zamere Pasternak RN MSN CCM Care Management [phone]

## 2015-05-01 NOTE — Progress Notes (Signed)
Attempted to see pt x 3 and he is not in room nor in the places that he is supposed to be  Suspect MRI will be negative, please call us if positive  DO not believe that this was a TIA but could be a seizure in which he is adequately controlled -  Continue VPA 500mg   BID -  Continue ASA 81mg  daily -  Call if MRI shows stroke -  Needs better BP control with goal < 130/80 -  Will sign off, please call with questions

## 2015-05-01 NOTE — Progress Notes (Signed)
Palliative Care Update  Palliative Care consult is initiated.  See full note to follow.  Suan HalterMargaret F Yaa Donnellan, MD

## 2015-05-02 DIAGNOSIS — G459 Transient cerebral ischemic attack, unspecified: Secondary | ICD-10-CM | POA: Diagnosis not present

## 2015-05-02 DIAGNOSIS — Z515 Encounter for palliative care: Secondary | ICD-10-CM

## 2015-05-02 NOTE — Clinical Social Work Note (Signed)
Clinical Social Work Assessment  Patient Details  Name: [name] MRN: [id number] Date of Birth: 1934-10-05  Date of referral:  05/02/15               Reason for consult:  Other (Comment Required), Facility Placement (From [facility] )                Permission sought to share information with:  Facility Art therapist granted to share information::  Yes, Verbal Permission Granted  Name::      [place]::     Relationship::     Contact Information:     Housing/Transportation Living arrangements for the past 2 months:  [place] of Information:  Patient, [place] [phone], [place] and only living relative) Patient Interpreter Needed:  None Criminal Activity/Legal Involvement Pertinent to Current Situation/Hospitalization:  No - Comment as needed Significant Relationships:  Other Family Members Lives with:  Facility Resident Do you feel safe going back to the place where you live?  Yes Need for family participation in patient care:  Yes (Comment)  Care giving concerns:  Patient is a resident at [facility].    Social Worker assessment / plan: Holiday representative (CSW) is familiar with patient from last admission. Patient is a readmission and came to [facility] 2 weeks ago. Patient is a resident at [facility] and is wheel chair bound at baseline. CSW met with patient alone at bedside. Patient was pleasantly confused and reported that he wanted to return to [place]. CSW also contacted patient's nephew [name] [phone], POA and only living relative. CSW discussed palliative care options with nephew at [place]. Nephew is open to palliative care. Nephew is agreeable for patient to return to [place].   Employment status:  Retired Forensic scientist:  Medicare PT Recommendations:  Not assessed at this time [place] / Referral to community  resources:  Other (Comment Required) ([place] )  Patient/Family's Response to care: Patient and nephew are agreeable to return to [place].   Patient/Family's Understanding of and Emotional Response to Diagnosis, Current Treatment, and Prognosis: Patient and nephew were pleasant.   Emotional Assessment Appearance:  Appears stated age Attitude/Demeanor/Rapport:    Affect (typically observed):  Accepting, Adaptable, Pleasant Orientation:  Oriented to Self, Fluctuating Orientation (Suspected and/or reported Sundowners) Alcohol / Substance use:  Not Applicable Psych involvement (Current and /or in the community):  No (Comment)  Discharge Needs  Concerns to be addressed:  Discharge Planning Concerns Readmission within the last 30 days:  Yes Current discharge risk:  None Barriers to Discharge:  No Barriers Identified   Loralyn Freshwater, LCSW 05/02/2015, 10:11 AM

## 2015-05-02 NOTE — Progress Notes (Signed)
EMS arrived for transport. Patient discharged via EMS to [facility]. Bo McclintockBrewer,Shatara Stanek S, RN

## 2015-05-02 NOTE — Consult Note (Signed)
Palliative Medicine Inpatient Consult Note   Name: [name] Date: 05/02/2015 MRN: [id number]  DOB: 1934/11/18  Referring Physician: Delfino Lovett, MD  Palliative Care consult requested for this 80 y.o. male for goals of medical therapy in patient with an apparent TIA.  He was already supposed to be admitted to [facility] [address] [facility] on the very day he was admitted.  I have spoken with Hospice Liaison and confirmed that this had been the plan. MRI was negative for CVA but symptoms were c/w TIA.    TODAY'S DISCUSSION AND PLAN: Pt is going to be returning to [facility]F and there he will be under the care of Hospice.  I confirmed in talking with Hospice Liaison that they are going to admit him to Hospice in the ALF setting.  There had been a recommendation for Palliative Care, but he is actually already lined up to be assessed to see if he can be a  Hospice patient there. The patient was thought to be eligible for Hospice Services under the diagnosis of dementia. The patient still talks in short sentences, however, and today, he is oriented to self and location and knows he is going back to [place].  He did not eat breakfast.  His oral intake varies quite a bit.   His albumin was low on Jul 23, 2014, at 2.7, but I do not see more recent albumin value.  He is getting a Dysphagia 3 diet with thin liquids.  He will be evaluated by Hospice Admissions Nurse at [facility] and his h/o dementia, cerebrovascular disease, seizure disorder, and probable protein calorie malnutrition are all important contributors to his need for consideration for admission to Hospice in the ALF setting.    REVIEW OF SYSTEMS:  Patient is not able to provide ROS due to dementia.   SOCIAL HISTORY:  reports that he quit smoking about 34 years ago. His smoking use included Cigarettes. He has a 30 pack-year smoking history. He has never used smokeless tobacco. He reports that he does not drink  alcohol or use illicit drugs.     Primary MD ='[facility]' (but demographic data state his primary is Producer, television/film/video Care)  LEGAL DOCUMENTS:  Portable DNR  CODE STATUS: DNR  PAST MEDICAL HISTORY: Past Medical History  Diagnosis Date  . COPD (chronic obstructive pulmonary disease) (HCC)   . Diabetes mellitus   . Hypertension   . Aortic dissection (HCC)     type B  . Deep vein thrombosis (HCC)     history of  . Renal insufficiency   . Movement disorder   . Seizure disorder (HCC)   . Essential hypertension, benign 11/11/2013  . Vascular dementia of acute onset without behavioral disturbance     PAST SURGICAL HISTORY:  Past Surgical History  Procedure Laterality Date  . Appendectomy    . Hernia repair    . Pelvic and para-aortic lymph node dissection      type B    ALLERGIES:  has No Known Allergies.  MEDICATIONS:  Current Facility-Administered Medications  Medication Dose Route Frequency Provider Last Rate Last Dose  . 0.9 %  sodium chloride infusion  250 mL Intravenous PRN Enedina Finner, MD      . acetaminophen (TYLENOL) tablet 650 mg  650 mg Oral Q6H PRN Enedina Finner, MD       Or  . acetaminophen (TYLENOL) suppository 650 mg  650 mg Rectal Q6H PRN Enedina Finner, MD      .  acetaminophen (TYLENOL) tablet 1,000 mg  1,000 mg Oral TID Enedina FinnerSona Patel, MD   1,000 mg at 05/01/15 2143  . aspirin EC tablet 81 mg  81 mg Oral Daily Enedina FinnerSona Patel, MD   81 mg at 05/01/15 1021  . divalproex (DEPAKOTE SPRINKLE) capsule 500 mg  500 mg Oral BID Enedina FinnerSona Patel, MD   500 mg at 05/01/15 2141  . enoxaparin (LOVENOX) injection 40 mg  40 mg Subcutaneous Q24H Enedina FinnerSona Patel, MD   40 mg at 05/01/15 2141  . gabapentin (NEURONTIN) capsule 100 mg  100 mg Oral TID Enedina FinnerSona Patel, MD   100 mg at 05/01/15 2143  . loperamide (IMODIUM) capsule 2-4 mg  2-4 mg Oral PRN Enedina FinnerSona Patel, MD      . memantine [facility](NAMENDA) tablet 10 mg  10 mg Oral BID Enedina FinnerSona Patel, MD   10 mg at 05/01/15 2143  . metoprolol tartrate (LOPRESSOR)  tablet 25 mg  25 mg Oral BID Enedina FinnerSona Patel, MD   25 mg at 05/01/15 2143  . ondansetron (ZOFRAN) tablet 4 mg  4 mg Oral Q6H PRN Enedina FinnerSona Patel, MD       Or  . ondansetron (ZOFRAN) injection 4 mg  4 mg Intravenous Q6H PRN Enedina FinnerSona Patel, MD      . pantoprazole (PROTONIX) EC tablet 40 mg  40 mg Oral Daily Enedina FinnerSona Patel, MD   40 mg at 05/01/15 1021  . sodium chloride 0.9 % injection 3 mL  3 mL Intravenous Q12H Enedina FinnerSona Patel, MD   3 mL at 05/01/15 2144  . sodium chloride 0.9 % injection 3 mL  3 mL Intravenous PRN Enedina FinnerSona Patel, MD        Vital Signs: BP 152/75 mmHg  Pulse 64  Temp(Src) 98.3 F (36.8 C) (Oral)  Resp 18  Ht 5\' 11"  (1.803 m)  Wt 69.945 kg (154 lb 3.2 oz)  BMI 21.52 kg/m2  SpO2 96% Filed Weights   04/30/15 1412 04/30/15 2004  Weight: 69.446 kg (153 lb 1.6 oz) 69.945 kg (154 lb 3.2 oz)    Estimated body mass index is 21.52 kg/(m^2) as calculated from the following:   Height as of this encounter: 5\' 11"  (1.803 m).   Weight as of this encounter: 69.945 kg (154 lb 3.2 oz).  PERFORMANCE STATUS (ECOG) : 4 - Bedbound  PHYSICAL EXAM: Sitting up in bed on medical floor.  NAD No JVD or TM Hrt rrr no mgr Lungs cta anteriorley Abd soft and NT Skin w/o Mottling or cyanosis He is alert w/o any facial asymetry. He says hello and when asked if he knows where he is, he said, " I found out today that I am in the Hospital".  He also said he was "going back to [facility] today".     LABS: CBC:    Component Value Date/Time   WBC 7.8 04/30/2015 2039   WBC 9.1 07/23/2014 1055   WBC 7.7 11/02/2013   HGB 12.5* 04/30/2015 2039   HGB 12.9* 07/23/2014 1055   HCT 38.7* 04/30/2015 2039   HCT 39.2* 07/23/2014 1055   PLT 190 04/30/2015 2039   PLT 192 07/23/2014 1055   MCV 90.5 04/30/2015 2039   MCV 92 07/23/2014 1055   NEUTROABS 8.1* 04/30/2015 1311   NEUTROABS 15.8* 12/24/2013 0155   LYMPHSABS 0.5* 04/30/2015 1311   LYMPHSABS 0.4* 12/24/2013 0155   MONOABS 0.8 04/30/2015 1311   MONOABS 1.5*  12/24/2013 0155   EOSABS 0.0 04/30/2015 1311   EOSABS 0.0 12/24/2013 0155   BASOSABS  0.0 04/30/2015 1311   BASOSABS 0.0 12/24/2013 0155   Comprehensive Metabolic Panel:    Component Value Date/Time   NA 140 04/30/2015 1311   NA 140 07/23/2014 1055   NA 139 11/02/2013   K 3.9 04/30/2015 1311   K 4.4 07/23/2014 1055   CL 104 04/30/2015 1311   CL 105 07/23/2014 1055   CO2 27 04/30/2015 1311   CO2 29 07/23/2014 1055   BUN 16 04/30/2015 1311   BUN 23* 07/23/2014 1055   BUN 31* 11/03/2013   CREATININE 0.77 04/30/2015 2039   CREATININE 1.24 07/23/2014 1055   CREATININE 1.0 11/03/2013   GLUCOSE 92 04/30/2015 1311   GLUCOSE 116* 07/23/2014 1055   CALCIUM 8.7* 04/30/2015 1311   CALCIUM 9.0 07/23/2014 1055   AST 17 07/23/2014 1055   AST 26 11/03/2013   ALT 15 07/23/2014 1055   ALT 16 11/03/2013   ALKPHOS 69 07/23/2014 1055   ALKPHOS 64 11/03/2013   BILITOT 0.3 07/23/2014 1055   BILITOT 0.4 04/24/2013 0951   PROT 6.9 07/23/2014 1055   PROT 7.0 04/24/2013 0951   ALBUMIN 2.7* 07/23/2014 1055   ALBUMIN 3.5 04/24/2013 0951    More than 50% of the visit was spent in counseling/coordination of care: Yes  Time Spent:  80 minutes

## 2015-05-02 NOTE — Progress Notes (Signed)
Patient is medically stable for D/C back to [facility]. Patient is wheel chair bound at baseline. Per Tiffany RN at [facility] patient can return. RN will call report to Tiffany and arrange EMS for transport. Clinical Child psychotherapistocial Worker (CSW) prepared D/C packet and faxed D/C Summary and FL2 to [employer]. Patient is aware of above. CSW contacted patient's nephew Marcy SalvoRaymond [phone] POA and made him aware of above. Please reconsult if future social work needs arise. CSW signing off.   Jetta LoutBailey Morgan, LCSWA [phone]

## 2015-05-02 NOTE — Progress Notes (Signed)
Report called to Elmarie Shileyiffany, Charity fundraiserN at [facility]. Patient is to be discharged back to facility when EMS arrives for transportation. Bo McclintockBrewer,Merriel Zinger S, RN

## 2015-05-02 NOTE — Progress Notes (Signed)
EMS called for transport. Brian Bunn S, RN  

## 2015-05-02 NOTE — Care Management Obs Status (Signed)
MEDICARE OBSERVATION STATUS NOTIFICATION   Patient Details  Name: [name] MRN: [id number] Date of Birth: 1935-05-17   Medicare Observation Status Notification Given:  Yes    Gwenette GreetBrenda S Mavric Cortright, RN 05/02/2015, 11:32 AM

## 2015-05-02 NOTE — [place] FL2 (Signed)
[place] MEDICAID FL2 LEVEL OF CARE SCREENING TOOL     IDENTIFICATION  Patient Name: Brian HumanJohn Zalesky Birthdate: 13-Jul-1934 Sex: male Admission Date (Current Location): 04/30/2015  [place]  [facility] [facility]   Facility and Address:  [facility], [address], [place], [place] [address]      Provider Number: [id number] [phone]  Attending Physician Name and Address:  Delfino LovettVipul Shah, MD  Relative Name and Phone Number:       Current Level of Care: Hospital Recommended Level of Care: Other (Comment), Assisted Living Facility (Memory Care Secured Unit ) Prior Approval Number:    Date Approved/Denied:   PASRR Number:  [id number])  Discharge Plan: Other (Comment) [facility] )    Current Diagnoses: Dementia (Primary)  Patient Active Problem List   Diagnosis Date Noted  . TIA (transient ischemic attack) 04/30/2015  . Pressure ulcer 04/18/2015  . Lethargy 04/16/2015  . Essential hypertension, benign 11/11/2013  . GERD (gastroesophageal reflux disease) 11/11/2013  . Vascular dementia without behavioral disturbance 11/11/2013  . Routine general medical examination at a health care facility 10/17/2012  . B12 DEFICIENCY 04/02/2009  . Seizure disorder (HCC) 04/02/2009  . DIABETES MELLITUS, TYPE II 09/26/2006  . COPD (chronic obstructive pulmonary disease) (HCC) 09/26/2006    Orientation ACTIVITIES/SOCIAL BLADDER RESPIRATION    Self  Passive Incontinent Normal  BEHAVIORAL SYMPTOMS/MOOD NEUROLOGICAL BOWEL NUTRITION STATUS   (none)  (none) Continent Diet (Diet: DYS 3 )  PHYSICIAN VISITS COMMUNICATION OF NEEDS Height & Weight Skin  30 days Verbally 5\' 11"  (180.3 cm) 154 lbs. Other (Comment) (Pressure Ulcer Stage 1: Sacrum)          AMBULATORY STATUS RESPIRATION    Assist extensive Normal      Personal Care Assistance Level of Assistance  Bathing, Feeding, Dressing Bathing Assistance: Limited  assistance Feeding assistance: Limited assistance Dressing Assistance: Limited assistance      Functional Limitations Info  Sight, Hearing, Speech Sight Info: Impaired Hearing Info: Adequate Speech Info: Adequate       SPECIAL CARE FACTORS FREQUENCY                      Additional Factors Info  Code Status Code Status Info:  (DNR)             Current Medications (05/02/2015): Current Facility-Administered Medications  Medication Dose Route Frequency Provider Last Rate Last Dose  . 0.9 %  sodium chloride infusion  250 mL Intravenous PRN Enedina FinnerSona Patel, MD      . acetaminophen (TYLENOL) tablet 650 mg  650 mg Oral Q6H PRN Enedina FinnerSona Patel, MD       Or  . acetaminophen (TYLENOL) suppository 650 mg  650 mg Rectal Q6H PRN Enedina FinnerSona Patel, MD      . acetaminophen (TYLENOL) tablet 1,000 mg  1,000 mg Oral TID Enedina FinnerSona Patel, MD   1,000 mg at 05/01/15 2143  . aspirin EC tablet 81 mg  81 mg Oral Daily Enedina FinnerSona Patel, MD   81 mg at 05/01/15 1021  . divalproex (DEPAKOTE SPRINKLE) capsule 500 mg  500 mg Oral BID Enedina FinnerSona Patel, MD   500 mg at 05/01/15 2141  . enoxaparin (LOVENOX) injection 40 mg  40 mg Subcutaneous Q24H Enedina FinnerSona Patel, MD   40 mg at 05/01/15 2141  . gabapentin (NEURONTIN) capsule 100 mg  100 mg Oral TID Enedina FinnerSona Patel, MD   100 mg at 05/01/15 2143  . loperamide (IMODIUM) capsule 2-4 mg  2-4 mg Oral PRN Enedina Finner, MD      . memantine [facility]) tablet 10 mg  10 mg Oral BID Enedina Finner, MD   10 mg at 05/01/15 2143  . metoprolol tartrate (LOPRESSOR) tablet 25 mg  25 mg Oral BID Enedina Finner, MD   25 mg at 05/01/15 2143  . ondansetron (ZOFRAN) tablet 4 mg  4 mg Oral Q6H PRN Enedina Finner, MD       Or  . ondansetron (ZOFRAN) injection 4 mg  4 mg Intravenous Q6H PRN Enedina Finner, MD      . pantoprazole (PROTONIX) EC tablet 40 mg  40 mg Oral Daily Enedina Finner, MD   40 mg at 05/01/15 1021  . sodium chloride 0.9 % injection 3 mL  3 mL Intravenous Q12H Enedina Finner, MD   3 mL at 05/01/15 2144  . sodium chloride 0.9 %  injection 3 mL  3 mL Intravenous PRN Enedina Finner, MD       Do not use this list as official medication orders. Please verify with discharge summary.  Discharge Medications:   Medication List    TAKE these medications        acetaminophen 500 MG tablet  Commonly known as:  TYLENOL  Take 1,000 mg by mouth 3 (three) times daily.     aspirin EC 81 MG tablet  Take 81 mg by mouth daily.     divalproex 125 MG capsule  Commonly known as:  DEPAKOTE SPRINKLE  Take 500 mg by mouth 2 (two) times daily.     ENDIT EX  Apply 1 application topically 2 (two) times daily as needed (for irritation).     gabapentin 100 MG capsule  Commonly known as:  NEURONTIN  Take 100 mg by mouth 3 (three) times daily.     loperamide 2 MG capsule  Commonly known as:  IMODIUM  Take 2-4 mg by mouth as needed for diarrhea or loose stools.     Melatonin 1 MG Tabs  Take 1 mg by mouth at bedtime.     memantine 10 MG tablet  Commonly known as:  NAMENDA  Take 10 mg by mouth 2 (two) times daily.     metoprolol tartrate 25 MG tablet  Commonly known as:  LOPRESSOR  Take 25 mg by mouth 2 (two) times daily.     omeprazole 20 MG capsule  Commonly known as:  PRILOSEC  Take 20 mg by mouth daily.        Relevant Imaging Results:  Relevant Lab Results:  Recent Labs    Additional Information  (SSN: [id number])  Haig Prophet, LCSW

## 2015-05-02 NOTE — Plan of Care (Signed)
Problem: Nutrition: Goal: Risk of aspiration will decrease Outcome: Progressing Patient on Dysphasia 3/soft diet.  Pills given whole in applesauce per speech therapy instructions.  Patient educated on need to swallow fully before taking more.  Patient checked for possibility of pocketing pills.  Patient without any s/s of aspiration or coughing during medication administration.  Problem: Tissue Perfusion: Goal: Cerebral tissue perfusion will improve Outcome: Progressing Patient VSS stable this shift.  Hourly rounding in place to assess patient condition and check for patient needs.  Patient able to rest throughout shift.  Problem: Safety: Goal: Ability to remain free from injury will improve Outcome: Progressing Patient safety maintained throughout shift.  Bed in lowest position with wheels locked.  Upper side rails up and locked.  Call bell within reach and bed alarm on throughout shift.

## 2015-05-02 NOTE — Discharge Summary (Addendum)
[facility] - [place] at [facility]   PATIENT NAME: [name]    MR#:  [id number]  DATE OF BIRTH:  [birth year]  DATE OF ADMISSION:  04/30/2015 ADMITTING PHYSICIAN: Enedina Finner, MD  DATE OF DISCHARGE: 05/02/2015  PRIMARY CARE PHYSICIAN: No primary care provider on file.    ADMISSION DIAGNOSIS:  CVA (cerebral infarction) [I63.9] Transient cerebral ischemia, unspecified transient cerebral ischemia type [G45.9] Altered mental status, unspecified altered mental status type [R41.82]  DISCHARGE DIAGNOSIS:  Active Problems:   TIA (transient ischemic attack)  SECONDARY DIAGNOSIS:   Past Medical History  Diagnosis Date  . COPD (chronic obstructive pulmonary disease) (HCC)   . Diabetes mellitus   . Hypertension   . Aortic dissection (HCC)     type B  . Deep vein thrombosis (HCC)     history of  . Renal insufficiency   . Movement disorder   . Seizure disorder (HCC)   . Essential hypertension, benign 11/11/2013  . Vascular dementia of acute onset without behavioral disturbance    HOSPITAL COURSE:  80 y.o. male with a known history of hypertension, diabetes, dementia who is bedbound lives at nursing home comes to the emergency room after staff there noticed patient had left facial droop and drooping of his left eye. Patient has dementia and unable to give any history or review of systems.  Please see Dr. Eliane Decree dictated history and physical for further details.  Patient underwent full neurological workup.  MRI was negative.  Neurology evaluation was performed by Dr. Katrinka Blazing who did not feel this to be TIA or stroke.  Certainly, seizure was in the differential, but he was thought to be on appropriate medication for same and no further medication adjustment was recommended.  Patient seems close to his baseline, although he is at very high risk for readmission.  He seems very appropriate for hospice while at the facility.  This can be started as palliative care  evaluation in the facility and we highly recommend not to get him back to the hospital unless absolutely needed and provide comfort care measures if Patient and family are in agreement. DISCHARGE CONDITIONS:  stable CONSULTS OBTAINED:  Treatment Team:  Mellody Drown, MD  DRUG ALLERGIES:  No Known Allergies DISCHARGE MEDICATIONS:   Current Discharge Medication List    CONTINUE these medications which have NOT CHANGED   Details  acetaminophen (TYLENOL) 500 MG tablet Take 1,000 mg by mouth 3 (three) times daily.     aspirin EC 81 MG tablet Take 81 mg by mouth daily.    divalproex (DEPAKOTE SPRINKLE) 125 MG capsule Take 500 mg by mouth 2 (two) times daily.    gabapentin (NEURONTIN) 100 MG capsule Take 100 mg by mouth 3 (three) times daily.    loperamide (IMODIUM) 2 MG capsule Take 2-4 mg by mouth as needed for diarrhea or loose stools.    Melatonin 1 MG TABS Take 1 mg by mouth at bedtime.    memantine (NAMENDA) 10 MG tablet Take 10 mg by mouth 2 (two) times daily.    metoprolol tartrate (LOPRESSOR) 25 MG tablet Take 25 mg by mouth 2 (two) times daily.    omeprazole (PRILOSEC) 20 MG capsule Take 20 mg by mouth daily.    Skin Protectants, Misc. (ENDIT EX) Apply 1 application topically 2 (two) times daily as needed (for irritation).       DISCHARGE INSTRUCTIONS:   DIET:  Cardiac diet DISCHARGE CONDITION:  Good ACTIVITY:  Activity as tolerated OXYGEN:  Home Oxygen: No.  Oxygen Delivery: room air DISCHARGE LOCATION:  home   If you experience worsening of your admission symptoms, develop shortness of breath, life threatening emergency, suicidal or homicidal thoughts you must seek medical attention immediately by calling 911 or calling your MD immediately  if symptoms less severe.  You Must read complete instructions/literature along with all the possible adverse reactions/side effects for all the Medicines you take and that have been prescribed to you. Take any new  Medicines after you have completely understood and accpet all the possible adverse reactions/side effects.   Please note  You were cared for by a hospitalist during your hospital stay. If you have any questions about your discharge medications or the care you received while you were in the hospital after you are discharged, you can call the unit and asked to speak with the hospitalist on call if the hospitalist that took care of you is not available. Once you are discharged, your primary care physician will handle any further medical issues. Please note that NO REFILLS for any discharge medications will be authorized once you are discharged, as it is imperative that you return to your primary care physician (or establish a relationship with a primary care physician if you do not have one) for your aftercare needs so that they can reassess your need for medications and monitor your lab values.    On the day of Discharge:   VITAL SIGNS:  Blood pressure 152/75, pulse 64, temperature 98.3 F (36.8 C), temperature source Oral, resp. rate 18, height  (1.803 m), weight 69.945 kg (154 lb 3.2 oz), SpO2 96 %. PHYSICAL EXAMINATION:  GENERAL:  80 y.o.-year-old patient lying in the bed with no acute distress.  EYES: Pupils equal, round, reactive to light and accommodation. No scleral icterus. Extraocular muscles intact.  HEENT: Head atraumatic, normocephalic. Oropharynx and nasopharynx clear.  NECK:  Supple, no jugular venous distention. No thyroid enlargement, no tenderness.  LUNGS: Normal breath sounds bilaterally, no wheezing, rales,rhonchi or crepitation. No use of accessory muscles of respiration.  CARDIOVASCULAR: S1, S2 normal. No murmurs, rubs, or gallops.  ABDOMEN: Soft, non-tender, non-distended. Bowel sounds present. No organomegaly or mass.  EXTREMITIES: No pedal edema, cyanosis, or clubbing.  NEUROLOGIC: Cranial nerves II through XII are intact. Muscle strength 5/5 in all extremities.  Sensation intact. Gait not checked.  PSYCHIATRIC: The patient is alert and pleasantly confused. Has baseline dementia SKIN: No obvious rash, lesion, or ulcer.  DATA REVIEW:   CBC  Recent Labs Lab 04/30/15 2039  WBC 7.8  HGB 12.5*  HCT 38.7*  PLT 190    Chemistries   Recent Labs Lab 04/30/15 1311 04/30/15 2039  NA 140  --   K 3.9  --   CL 104  --   CO2 27  --   GLUCOSE 92  --   BUN 16  --   CREATININE 0.84 0.77  CALCIUM 8.7*  --     Cardiac Enzymes  Recent Labs Lab 04/30/15 1311  TROPONINI <0.03    Microbiology Results  Results for orders placed or performed during the hospital encounter of 04/30/15  MRSA PCR Screening     Status: None   Collection Time: 05/01/15 12:21 AM  Result Value Ref Range Status   MRSA by PCR NEGATIVE NEGATIVE Final    Comment:        The GeneXpert MRSA Assay (FDA approved for NASAL specimens only), is one component of a comprehensive MRSA colonization surveillance program.  It is not intended to diagnose MRSA infection nor to guide or monitor treatment for MRSA infections.     RADIOLOGY:  Ct Head Wo Contrast  04/30/2015  CLINICAL DATA:  80 year old male with altered mental status. Code stroke. EXAM: CT HEAD WITHOUT CONTRAST TECHNIQUE: Contiguous axial images were obtained from the base of the skull through the vertex without intravenous contrast. COMPARISON:  Head CT 04/16/2015. FINDINGS: Mild cerebral atrophy. Patchy and confluent areas of decreased attenuation are noted throughout the deep and periventricular white matter of the cerebral hemispheres bilaterally, compatible with chronic microvascular ischemic disease. Multiple old lacunar infarcts in the right thalamus and right basal ganglia, similar to the prior examination. No acute intracranial abnormalities. Specifically, no evidence of acute intracranial hemorrhage, no definite findings of acute/subacute cerebral ischemia, no mass, mass effect, hydrocephalus or abnormal intra  or extra-axial fluid collections. Visualized paranasal sinuses and mastoids are well pneumatized. No acute displaced skull fractures are identified. IMPRESSION: 1. No acute intracranial abnormalities. 2. Mild cerebral atrophy with extensive chronic microvascular ischemic changes in cerebral white matter, and multiple right-sided thalamic and basal ganglia lacunar infarcts, unchanged compared to the recent prior study from 04/16/2015. Electronically Signed   By: Trudie Reed M.D.   On: 04/30/2015 13:04   Mr Brain Wo Contrast  05/01/2015  CLINICAL DATA:  80 year old male with altered mental status, combative. Initial encounter. EXAM: MRI HEAD WITHOUT CONTRAST TECHNIQUE: Multiplanar, multiecho pulse sequences of the brain and surrounding structures were obtained without intravenous contrast. COMPARISON:  Head CT without contrast 04/30/2015 and earlier. Brain MRI 02/19/2009. FINDINGS: The examination had to be discontinued prior to completion due to patient agitation. Axial T1 and coronal T2 weighted imaging was not obtained. Study is intermittently degraded by motion artifact despite repeated imaging atte Mpts. There has been some generalized cerebral volume loss since 2010. Ventricular size and configuration are within normal limits. No midline shift, mass effect, or evidence of intracranial mass lesion. No acute intracranial hemorrhage identified. Chronic intracranial artery dolichoectasia. Grossly stable Major intracranial vascular flow voids . Dominant left vertebral artery re- demonstrated. No restricted diffusion or evidence of acute infarction. Extensive chronic T2 heterogeneity throughout the deep gray matter nuclei has not significantly changed compatible with a combination of chronic lacunar infarcts and dilated perivascular spaces. Superimposed confluent cerebral white matter FLAIR hyperintensity has not significantly changed. Small chronic cerebellar lacunar infarcts have not significantly changed.  Grossly negative pituitary and cervicomedullary junction. Orbit and scalp soft tissues are stable. Mild paranasal sinus mucosal thickening is stable. Trace left mastoid effusion has not significantly changed. Small volume retained secretions in the nasopharynx. IMPRESSION: 1.  No acute intracranial abnormality. 2. Advanced chronic small vessel disease appears not significantly changed since 2010. 3. Mildly motion degraded and prematurely discontinued exam due to patient agitation. Electronically Signed   By: Odessa Fleming M.D.   On: 05/01/2015 12:44   Dg Chest Port 1 View  04/30/2015  CLINICAL DATA:  cough EXAM: PORTABLE CHEST 1 VIEW COMPARISON:  04/16/2015 FINDINGS: Lungs are under aerated and grossly clear. Aortic knob remains prominent. Heart is mildly enlarged. Normal vascularity. No pneumothorax or pleural effusion. IMPRESSION: No active cardiopulmonary disease.  Low lung volumes. Electronically Signed   By: Jolaine Click M.D.   On: 04/30/2015 16:07     Management plans discussed with the patient, family and they are in agreement.  CODE STATUS:     Code Status Orders        Start     Ordered  04/30/15 1951  Do not attempt resuscitation (DNR)   Continuous    Question Answer Comment  In the event of cardiac or respiratory ARREST Do not call a "code blue"   In the event of cardiac or respiratory ARREST Do not perform Intubation, CPR, defibrillation or ACLS   In the event of cardiac or respiratory ARREST Use medication by any route, position, wound care, and other measures to relive pain and suffering. May use oxygen, suction and manual treatment of airway obstruction as needed for comfort.      04/30/15 1950    Advance Directive Documentation        Most Recent Value   Type of Advance Directive  Out of facility DNR (pink MOST or yellow form)   Pre-existing out of facility DNR order (yellow form or pink MOST form)  Yellow form placed in chart (order not valid for inpatient use)   "MOST" Form  in Place?        He is at very high risk for readmission.  Highly recommend considering palliative care and hospice while at the facility rather than sending him back to the emergency room unless absolutely felt need  TOTAL TIME TAKING CARE OF THIS PATIENT: 55 minutes.    [facility], Aerial Dilley M.D on 05/02/2015 at 8:32 AM  Between 7am to 6pm - Pager - [phone]  After 6pm go to [email or web address] - password [email or web address] [facility]  [facility]  Office  [phone]  CC: Primary care physician; No primary care provider on file.

## 2015-05-02 NOTE — Progress Notes (Signed)
Follow-up on patient who was referred to Hospice services, however transported to the ED prior to Hospice admission. This plan discussed with Terrilee CroakBrenda Holland, RN, CM. Patient being [facility]. Updated information faxed to Referral Intake.  EMS arrival and patient discharged effective 11:30am.

## 2015-05-13 NOTE — Progress Notes (Signed)
05/01/15 1100  SLP Visit Information  SLP Received On 05/01/15  Subjective  Subjective Pt awake and alert to task, converses appropriately with ST.  Patient/Family Stated Goal none given  General Information  Date of Onset 04/30/15  HPI [name] is a 80 y.o. male with a known history of hypertension, diabetes, dementia who is bedbound lives at nursing home comes to the emergency room after staff there noticed patient had left facial droop and drooping of his left eye. Patient has dementia and unable to give any history or review of systems. He does have some cough which is more upper airway cough. Family in the room denies any coughing or choking while swallowing.  Temperature Spikes Noted No  History of Recent Intubation No  Oral Cavity - Dentition Edentulous  Self-Feeding Abilities Needs assist;Needs set up  Volitional Cough Congested (at baseline)  Volitional Swallow Able to elicit  Type of Study Bedside swallow evaluation  Respiratory Status Room air  Behavior/Cognition Alert;Cooperative;Confused (pt has hx of baseline vascular dementia per chart)  Patient Positioning Upright in bed  Baseline Vocal Quality Wet (adaquate vocal intensity, slightly wet sounding)  Oral Assessment (Complete on admission/transfer/change in patient condition)  Does patient have any of the following "high risk" factors? Tongue - red, shiny, blistered, cracked  Does patient have any of the following "at risk" factors? Tongue - coated  Patient is HIGH RISK: non-ventilated Order set for Oral care protocol initiated - "High Risk" option selected  (see row information)  Oral Motor/Sensory Function  Facial Symmetry WFL  Lingual Symmetry WFL  Mandible WFL (as observed in bolus management)  Facial ROM WFL  Facial Symmetry WFL  Overall Oral Motor/Sensory Function Appears within functional limits for tasks assessed  Ice Chips  Ice chips [facility]  Presentation Spoon  Other Comments Pt consumed 5 single ice  chips w/no overt or immediate s/s of aspiration  Thin Liquid  Thin Liquid WFL  Presentation Cup;Straw  Other Comments Pt had coughing episode after multiple sips by straw, however tolerated cup drinking with no immediate s/s of aspiration noted.  Nectar Thick Liquid  Nectar Thick Liquid NT  Honey Thick Liquid  Honey Thick Liquid NT  Puree  Puree WFL  Presentation Spoon  Other Comments Pt consumed 15+ bites of applesauce with no immediate or overt s/s of aspiration  Solid  Solid WFL  Presentation Spoon  Other Comments Pt consumed 8 graham crackers, soaked in apple sauce with no overt or immediate s/s of aspiration noted.  SLP - End of Session  Patient left in bed;with call bell/phone within reach;with bed alarm set  Nurse Communication Aspiration precautions reviewed;Diet recommendation  Suspected Esophageal Findings  Suspected Esophageal Findings Belching (min after liquid)  SLP Assessment  Clinical Impression Statement (ACUTE ONLY) Pt presented with min oral phase dysphagia c/b poor labial seal around spoon on presentation of ice chips, but adaquate labial closure with other bolus consistencies. Pt has a baseline congested cough and known hx of dementia (per chart).  Pt was oriented to task and consumexd 15+ bites of applesauce, and 8 graham crackers soaked in applesauce with no immediate or overt s/s of aspiration noted. Pt began drinking small sips of thin liquids from cup and consumed 5 trials with no overt or immediate s/s of aspiration. Pt then consumed multiple sips from straw.  After ~4 multiple sips, Pt had coughing episode. ST gave time to recover, then presented additional trials of applesauce before introducing cup drinking of thin liquids again. In  final trials of thins by cup, Pt did not demonstrate any immediate or overt s/s of aspiration. Pt would benefit from a dysphagia 2 diet with thin liquids and strict aspiration precautions (no straws). Meds whole in puree as able, but  crushed as necessary (and able). NSG updated.  Other Related Risk Factors Previous CVA;Cognitive impairment  Risk for Aspiration Mild  Swallow Evaluation Recommendations  Medication Administration Whole meds with puree (crush if necessary/able)  SLP Diet Recommendations Dysphagia 2 (Fine chop);Thin  Compensations Minimize environmental distractions;Slow rate;Small sips/bites;Check for pocketing;Follow solids with liquid  Postural Changes Seated upright at 90 degrees  Liquid Administration via No straw;Cup  Supervision Staff to assist with self feeding;Full supervision/cueing for compensatory strategies  Treatment Plan  Oral Care Recommendations Oral care QID;Oral care before and after PO;Staff/trained caregiver to provide oral care  Treatment Recommendations Therapy as outlined in treatment plan below  Speech Therapy Frequency (ACUTE ONLY) min 3x week  Treatment Duration 1 week  Interventions Aspiration precaution training;Diet toleration management by SLP  Prognosis  Prognosis for Safe Diet Advancement Fair  Barriers to Reach Goals Cognitive deficits  Individuals Consulted  Consulted and Agree with Results and Recommendations Patient;RN  SLP Time Calculation  SLP Start Time (ACUTE ONLY) 0830  SLP Stop Time (ACUTE ONLY) 0915  SLP Time Calculation (min) (ACUTE ONLY) 45 min  SLP G-Codes **NOT FOR INPATIENT CLASS**  Functional Assessment Tool Used clinical judgment  Functional Limitations Swallowing  Swallow Current Status (W0981) CI  Swallow Goal Status (X9147) CI  Swallow Discharge Status (W2956) CI  SLP Evaluations  $ SLP Speech Visit 1 Procedure  SLP Evaluations  $BSS Swallow 1 Procedure   Late entry G code entered after chart review of initial evaluation as performed by supervising therapist.  Jerilynn Som, MS, CCC-SLP 05/13/2015 8:27 AM

## 2015-10-27 DEATH — deceased

## 2016-04-06 ENCOUNTER — Encounter: Payer: Self-pay | Admitting: Gastroenterology

## 2016-04-27 ENCOUNTER — Encounter: Payer: Self-pay | Admitting: Gastroenterology

## 2016-06-17 IMAGING — MR MR HEAD W/O CM
8 series · 48 of 48 positions shown · non-contrast
Comparison: Head CT without contrast 04/30/2015 and earlier. Brain
MRI 02/19/2009.

CLINICAL DATA: 79-year-old male with altered mental status,
combative. Initial encounter.

EXAM:
MRI HEAD WITHOUT CONTRAST
TECHNIQUE: Multiplanar, multiecho pulse sequences of the brain and surrounding
structures were obtained without intravenous contrast.

[Series 2: GRE · sagittal · 5.0mm · 0.45mm/px · 2 of 23 slices shown]
[im 1/23]
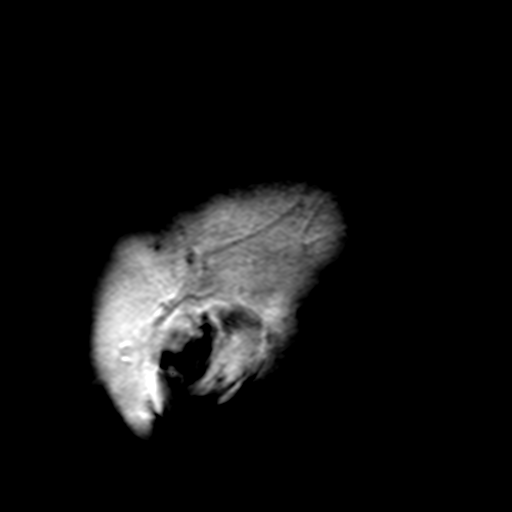
[im 23/23]
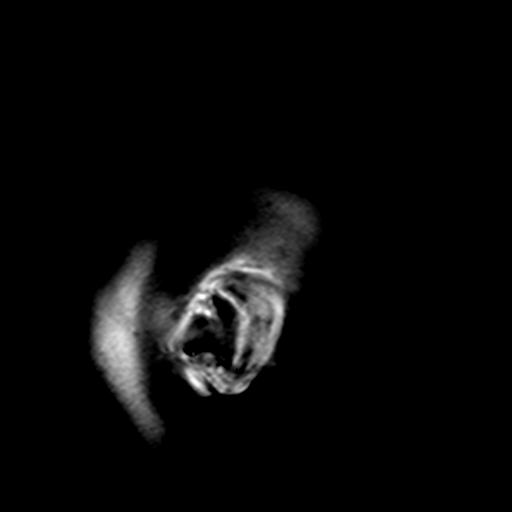

[Series 7: T2 · axial · 5.0mm · 0.45mm/px · z∈[-36,+98]mm · 4 of 25 slices shown (1 of 2)]
[im 1/25]
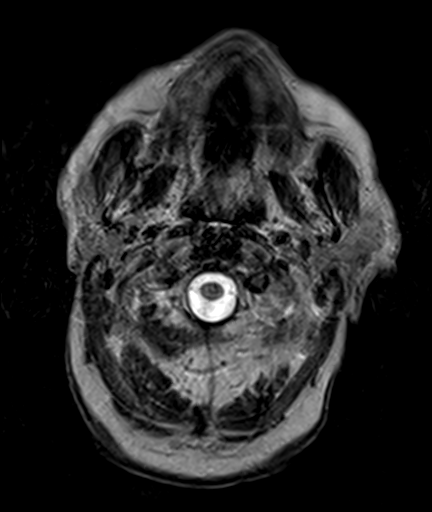
[im 9/25]
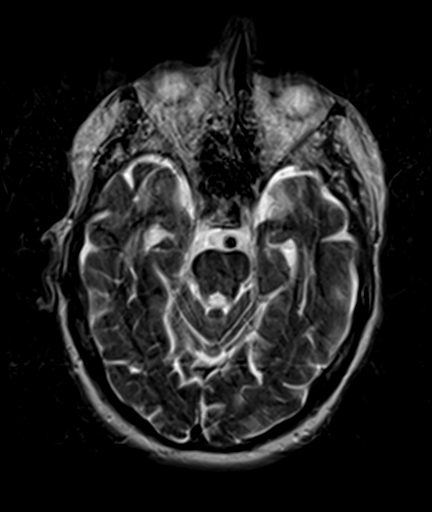
[im 17/25]
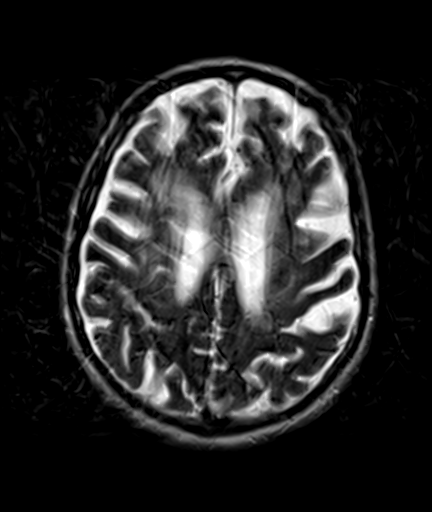
[im 25/25]
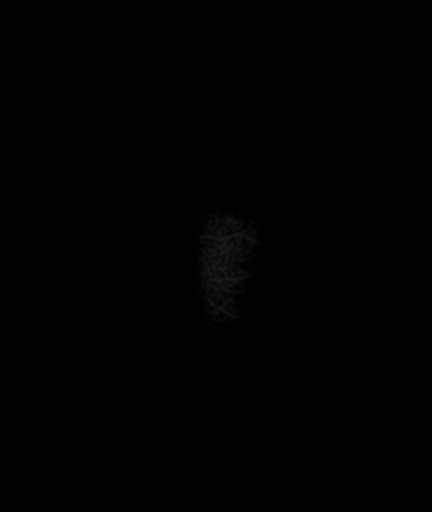

[Series 8: FLAIR · axial · 5.0mm · 0.45mm/px · z∈[-36,+98]mm · 4 of 25 slices shown]
[im 1/25]
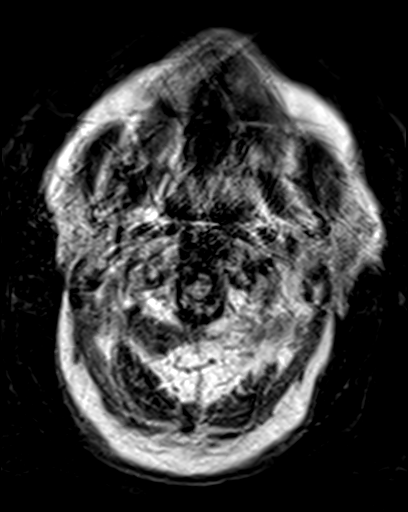
[im 9/25]
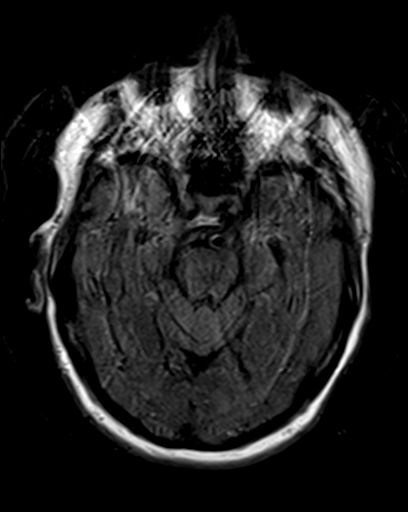
[im 17/25]
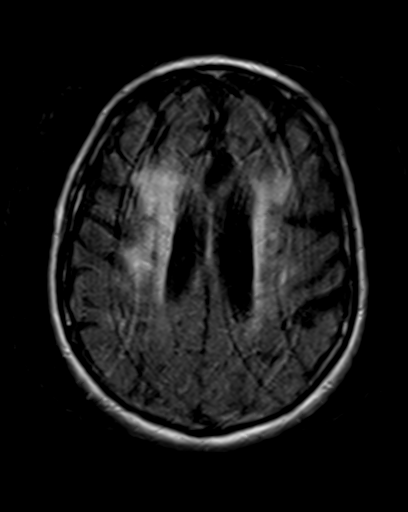
[im 25/25]
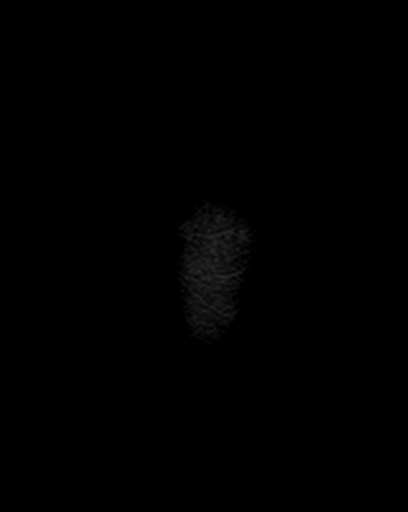

[Series 9: T2 · axial · 5.0mm · 1.20mm/px · z∈[-37,+97]mm · 4 of 25 slices shown (2 of 2)]
[im 1/25]
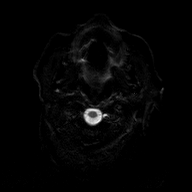
[im 9/25]
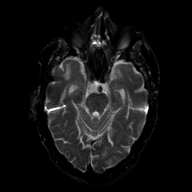
[im 17/25]
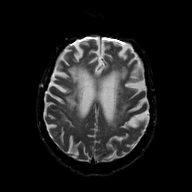
[im 25/25]
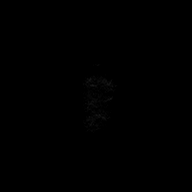

[Series 100: DWI · axial · 3.0mm · 1.80mm/px · z∈[-43,+96]mm · 8 of 55 slices shown (1 of 2)]
[im 1/55]
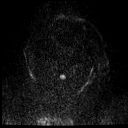
[im 8/55]
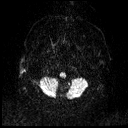
[im 16/55]
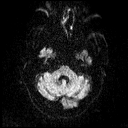
[im 24/55]
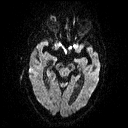
[im 31/55]
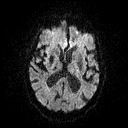
[im 39/55]
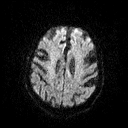
[im 47/55]
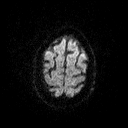
[im 55/55]
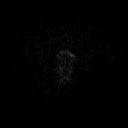

[Series 101: ADC · axial · 3.0mm · 1.80mm/px · z∈[-43,+96]mm · 8 of 55 slices shown (1 of 2)]
[im 1/55]
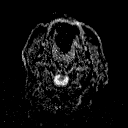
[im 8/55]
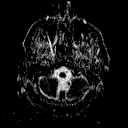
[im 16/55]
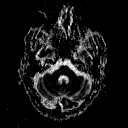
[im 24/55]
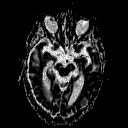
[im 31/55]
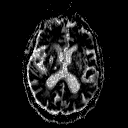
[im 39/55]
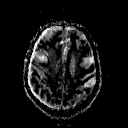
[im 47/55]
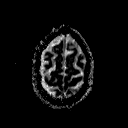
[im 55/55]
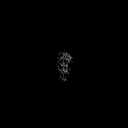

[Series 102: DWI · coronal · 3.0mm · 1.80mm/px · 9 of 60 slices shown (2 of 2)]
[im 1/60]
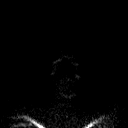
[im 8/60]
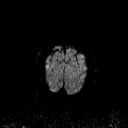
[im 15/60]
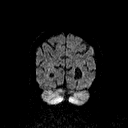
[im 23/60]
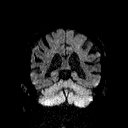
[im 30/60]
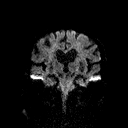
[im 37/60]
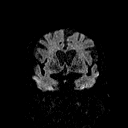
[im 45/60]
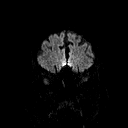
[im 52/60]
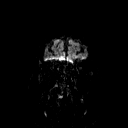
[im 60/60]
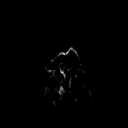

[Series 103: ADC · coronal · 3.0mm · 1.80mm/px · 9 of 60 slices shown (2 of 2)]
[im 1/60]
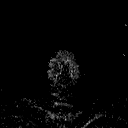
[im 8/60]
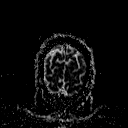
[im 15/60]
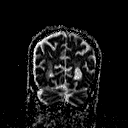
[im 23/60]
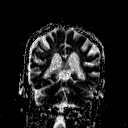
[im 30/60]
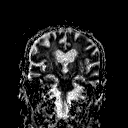
[im 37/60]
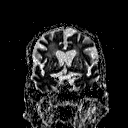
[im 45/60]
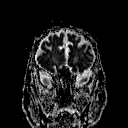
[im 52/60]
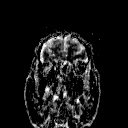
[im 60/60]
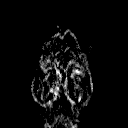

[48 of 48 positions shown; findings below may reference images not displayed]

FINDINGS: The examination had to be discontinued prior to completion due to
patient agitation. Axial T1 and coronal T2 weighted imaging was not
obtained.

Study is intermittently degraded by motion artifact despite repeated
imaging atte

Mpts. There has been some generalized cerebral volume loss since
4838. Ventricular size and configuration are within normal limits.
No midline shift, mass effect, or evidence of intracranial mass
lesion. No acute intracranial hemorrhage identified. Chronic
intracranial artery dolichoectasia. Grossly stable Major
intracranial vascular flow voids . Dominant left vertebral artery
re- demonstrated.

No restricted diffusion or evidence of acute infarction. Extensive
chronic T2 heterogeneity throughout the deep gray matter nuclei has
not significantly changed compatible with a combination of chronic
lacunar infarcts and dilated perivascular spaces. Superimposed
confluent cerebral white matter FLAIR hyperintensity has not
significantly changed. Small chronic cerebellar lacunar infarcts
have not significantly changed. Grossly negative pituitary and
cervicomedullary junction.

Orbit and scalp soft tissues are stable. Mild paranasal sinus
mucosal thickening is stable. Trace left mastoid effusion has not
significantly changed. Small volume retained secretions in the
nasopharynx.
IMPRESSION: 1.  No acute intracranial abnormality.
2. Advanced chronic small vessel disease appears not significantly
changed since [DATE]. Mildly motion degraded and prematurely discontinued exam due to
patient agitation.
